# Patient Record
Sex: Female | Born: 1965 | Hispanic: Yes | Marital: Married | Smoking: Never smoker
Health system: Southern US, Community
[De-identification: ages and names within clinical notes are randomized; demographics above are authoritative.]

## PROBLEM LIST (undated history)

## (undated) DIAGNOSIS — E079 Disorder of thyroid, unspecified: Secondary | ICD-10-CM

## (undated) DIAGNOSIS — Z8639 Personal history of other endocrine, nutritional and metabolic disease: Secondary | ICD-10-CM

## (undated) DIAGNOSIS — I10 Essential (primary) hypertension: Secondary | ICD-10-CM

## (undated) DIAGNOSIS — K219 Gastro-esophageal reflux disease without esophagitis: Secondary | ICD-10-CM

## (undated) HISTORY — DX: Disorder of thyroid, unspecified: E07.9

## (undated) HISTORY — DX: Personal history of other endocrine, nutritional and metabolic disease: Z86.39

## (undated) HISTORY — DX: Essential (primary) hypertension: I10

## (undated) HISTORY — PX: TUBAL LIGATION: SHX77

## (undated) HISTORY — DX: Gastro-esophageal reflux disease without esophagitis: K21.9

---

## 2017-03-07 DIAGNOSIS — Z139 Encounter for screening, unspecified: Secondary | ICD-10-CM

## 2017-03-07 LAB — GLUCOSE, POCT (MANUAL RESULT ENTRY): POC GLUCOSE: 96 mg/dL (ref 70–99)

## 2017-03-07 NOTE — Congregational Nurse Program (Unsigned)
Congregational Nurse Program Note  Date of Encounter: 03/07/2017  Past Medical History: No past medical history on file.  Encounter Details:     CNP Questionnaire - 03/07/17 1020      Patient Demographics   Is this a new or existing patient? New   Patient is considered a/an Immigrant   Race Latino/Hispanic     Patient Assistance   Location of Patient Assistance MD Live - [facility]   Patient's financial/insurance status Self-Pay (Uninsured)   Uninsured Patient ([place] Card/[employer] Connects) Yes   Interventions Assisted patient in making appt.   Patient referred to apply for the following financial assistance Not Applicable   Food insecurities addressed Not Applicable   Transportation assistance No   Assistance securing medications Yes   Type of Assistance [employer] health offerings Nutrition;Hypertension;Cardiac disease;Navigating the healthcare system     Encounter Details   Primary purpose of visit Chronic Illness/Condition Visit   Was an Emergency Department visit averted? No   Does patient have a medical provider? No   Patient referred to Clinic   Was a mental health screening completed? (GAINS tool) No   Does patient have dental issues? Yes   Was a dental referral made? No   Does patient have vision issues? Yes   Was a vision referral made? No   Does your patient have an abnormal blood pressure today? Yes   Since previous encounter, have you referred patient for abnormal blood pressure that resulted in a new diagnosis or medication change? No   Since previous encounter, have you referred patient for abnormal blood glucose that resulted in a new diagnosis or medication change? No   Was there a life-saving intervention made? No      Patient was pleasant and humble.  Patient was here due to complaints of headache (side) but none now.  Patients stated she may have high blood pressure.  Once in exam room patients vitals were taken and were 177/114 74  98.1 and rechecked 5 minutes later and was 182/111 67.   Glucose:96 fasting  Patient said " I take Excedrin for my headaches."  MD live was attempted but was having difficulties with it.Patient was hungry so she said she would be back after lunch.  Before patient left Nurses urged Patient to got ER or call 911 if she had symptoms of numbness, chest pain, worsening headaches, facial dropping, and slurred speech.   Patient returned after lunch. Patients vitals were 161/103 92 and 160/101 90.  After several attempts, MD live was used via phone with patient. Dr. Meyer Russel prescribed her Lisinopril/ HCTZ 20/25 mg po daily.   Information faxed to [facility].  Advised patient to take it every day at the same time. Also told patient to drink water, due to frequency of urinating.   [facility] at 8:15  Education information given to patient in regards to hypertension, decrease salt intake, decrease stress and healthy tips.    Patient was reminded to  to got ER or call 911 if she had symptoms of numbness, chest pain, worsening headaches, facial dropping, and slurred speech.  Also if she had any other questions to call [facility] and will gladly help her as much as we could.   Shaley Leavens R. Cutler Sunday LPN [phone]

## 2017-03-09 ENCOUNTER — Telehealth: Payer: Self-pay

## 2017-03-09 NOTE — Telephone Encounter (Signed)
[facility] Patient chief complaint when in the office was headaches and check blood pressure.   Patients blood pressure was elevated starting at 177/114 74. Upon doing a few checks blood pressure maintained elevated. MD live was done and MD Meyer Russel prescribed Lisinopril HCTZ 20/25 mg po daily.  Patient was happy for the follow-up call and said she wasn't able to make it today for a blood pressure recheck at 9 but mentioned she still continues to have headaches and said her husband checked it yesterday and was in the 160 range.   She also mentioned she felt really bad. Referring to after taking her medication and rather take it at night.  Let the patient know that  the headaches are possibly due to an elevated blood pressure. Also told patient it has not been long that she has started medication it may take a little time for body to adjust.    Let patient know that if her headaches get any worse, chest pain, numbness, slurred speech, facial dropping. To please call 911 or got ER right away.   Also if she has any other questions to let [facility] know and will gladly help in any way we can. Reminded patient of her [facility]17 and to mention her symptoms to provider and if MD.    Monia Pouch. Kimmberly Wisser LPN [phone]

## 2017-03-16 ENCOUNTER — Encounter: Payer: Self-pay | Admitting: Physician Assistant

## 2017-03-16 ENCOUNTER — Ambulatory Visit: Payer: Self-pay | Admitting: Physician Assistant

## 2017-03-16 VITALS — BP 136/96 | HR 86 | Temp 97.7°F | Ht 61.0 in | Wt 155.0 lb

## 2017-03-16 DIAGNOSIS — Z131 Encounter for screening for diabetes mellitus: Secondary | ICD-10-CM

## 2017-03-16 DIAGNOSIS — I10 Essential (primary) hypertension: Secondary | ICD-10-CM | POA: Insufficient documentation

## 2017-03-16 DIAGNOSIS — Z8639 Personal history of other endocrine, nutritional and metabolic disease: Secondary | ICD-10-CM

## 2017-03-16 DIAGNOSIS — Z13220 Encounter for screening for lipoid disorders: Secondary | ICD-10-CM

## 2017-03-16 MED ORDER — LISINOPRIL-HYDROCHLOROTHIAZIDE 20-25 MG PO TABS: ORAL_TABLET | ORAL | 0 refills | Status: DC

## 2017-03-16 NOTE — Progress Notes (Signed)
BP (!) 136/96 (BP Location: Left Arm, Patient Position: Sitting, Cuff Size: Normal)   Pulse 86   Temp 97.7 F (36.5 C)   Ht  (1.549 m)   Wt 155 lb (70.3 kg)   SpO2 99%   BMI 29.29 kg/m    Subjective:    Patient ID: [facility], female    DOB: 03-07-1966, 51 y.o.   MRN: [id number]  HPI: [facility] is a 51 y.o. female presenting on 03/16/2017 for New Patient (Initial Visit)   HPI   She is taking the rx given to her at [facility] last week.  That is the first time she had to be on bp medication.  She is not having problems with HA or CP.    She had a mammogram only once about 10 years ago.   Her last PAP ws 2 years ago in [place].   LMP 4 months ago.   Relevant past medical, surgical, family and social history reviewed and updated as indicated. Interim medical history since our last visit reviewed. Allergies and medications reviewed and updated.   Current Outpatient Prescriptions:  .  lisinopril-hydrochlorothiazide (PRINZIDE,ZESTORETIC) 20-25 MG tablet, Take 1 tablet by mouth daily., Disp: , Rfl:   Review of Systems  Constitutional: Negative for appetite change, chills, diaphoresis, fatigue, fever and unexpected weight change.  HENT: Positive for dental problem and hearing loss. Negative for congestion, drooling, ear pain, facial swelling, mouth sores, sneezing, sore throat, trouble swallowing and voice change.   Eyes: Negative for pain, discharge, redness, itching and visual disturbance.  Respiratory: Negative for cough, choking, shortness of breath and wheezing.   Cardiovascular: Negative for chest pain, palpitations and leg swelling.  Gastrointestinal: Negative for abdominal pain, blood in stool, constipation, diarrhea and vomiting.  Endocrine: Negative for cold intolerance, heat intolerance and polydipsia.  Genitourinary: Negative for decreased urine volume, dysuria and hematuria.  Musculoskeletal: Negative for arthralgias, back pain and gait problem.  Skin: Negative  for rash.  Allergic/Immunologic: Negative for environmental allergies.  Neurological: Negative for seizures, syncope, light-headedness and headaches.  Hematological: Negative for adenopathy.  Psychiatric/Behavioral: Negative for agitation, dysphoric mood and suicidal ideas. The patient is not nervous/anxious.     Per HPI unless specifically indicated above     Objective:    BP (!) 136/96 (BP Location: Left Arm, Patient Position: Sitting, Cuff Size: Normal)   Pulse 86   Temp 97.7 F (36.5 C)   Ht  (1.549 m)   Wt 155 lb (70.3 kg)   SpO2 99%   BMI 29.29 kg/m   Wt Readings from Last 3 Encounters:  03/16/17 155 lb (70.3 kg)  03/07/17 156 lb 12.8 oz (71.1 kg)    Physical Exam  Constitutional: She is oriented to person, place, and time. She appears well-developed and well-nourished.  HENT:  Head: Normocephalic and atraumatic.  Mouth/Throat: Oropharynx is clear and moist. No oropharyngeal exudate.  Eyes: Conjunctivae and EOM are normal. Pupils are equal, round, and reactive to light.  Neck: Neck supple. No thyromegaly present.  Cardiovascular: Normal rate and regular rhythm.   Pulmonary/Chest: Effort normal and breath sounds normal.  Abdominal: Soft. Bowel sounds are normal. She exhibits no mass. There is no hepatosplenomegaly. There is no tenderness.  Musculoskeletal: She exhibits no edema.  Lymphadenopathy:    She has no cervical adenopathy.  Neurological: She is alert and oriented to person, place, and time. Gait normal.  Skin: Skin is warm and dry.  Psychiatric: She has a normal mood and  affect. Her behavior is normal.  Vitals reviewed.   Results for orders placed or performed in visit on 03/07/17  POCT glucose  Result Value Ref Range   POC Glucose 96 70 - 99 mg/dl      Assessment & Plan:   Encounter Diagnoses  Name Primary?  . Essential hypertension Yes  . History of thyroid disorder   . Screening cholesterol level   . Screening for diabetes mellitus      -continue current rx -Get baseline labs -Follow up one month.  Will discuss mammogram and PAP at that time.  RTO sooner prn

## 2017-03-17 ENCOUNTER — Telehealth: Payer: Self-pay

## 2017-03-17 NOTE — Telephone Encounter (Signed)
Pt was [facility] at [facility]. After MD live doctor prescribing her Lisinopril/HCTZ 20/25 RN here set her [facility] which was yesterday.   Follow-up call was made to see how patient was doing and how her PCP appointment went.   There was no answer, but LPN left message with patient to see how she is doing and to call [facility] for any questions.     Amoreena Neubert R. Avory Rahimi LPN [phone]

## 2017-03-17 NOTE — Telephone Encounter (Signed)
Nurse returned call with answers from pt recent call with questions.  Pt asked for her last name to be changed and nurse told her that she will have to look into that, and that for now she will stay as [facility] until she can make that correction, if she is able to.  Again nurse apologized for her experience with the doctor, in regards to provider questioning where she lived because of her tag.   Nurse voiced to patient that provider may have been asking because the [facility] only [facility] [place] [facility] and because of her tag from [place] it was questionable and pt does not have id with adress. Pt understood better with that explanation.   Also let her know that she does not need an appointment to get blood drawn and that she can just walk in.   Pt was thankful for the help.  Cledis Sohn R. [name] [phone]

## 2017-03-17 NOTE — Telephone Encounter (Signed)
Pt returned call to [facility].  Pt voiced that appointment went well up to the point that the provider was asking about her license plate and how it was a [place]'s tag.   Pt stated " I don't know if she is racist or what but I did not like that she was getting into my personal things."  Nurse apologized for her feeling that way.   Pt stated she did not know when she had to go to get obtain her blood work and if she will need and ID since pt only has a passport.   Pt also said she wants to change her name to Karina [facility] instead of [facility].  Nurse told patient she will give her a call when she had answers.   Shaletta Hinostroza R. Ariyanah Aguado LPN [phone]

## 2017-03-18 LAB — COMPREHENSIVE METABOLIC PANEL
ALT: 28 U/L (ref 6–29)
AST: 30 U/L (ref 10–35)
Albumin: 4 g/dL (ref 3.6–5.1)
Alkaline Phosphatase: 103 U/L (ref 33–115)
BILIRUBIN TOTAL: 0.6 mg/dL (ref 0.2–1.2)
BUN: 16 mg/dL (ref 7–25)
CO2: 29 mmol/L (ref 20–31)
Calcium: 9.6 mg/dL (ref 8.6–10.2)
Chloride: 100 mmol/L (ref 98–110)
Creat: 0.76 mg/dL (ref 0.50–1.10)
Glucose, Bld: 84 mg/dL (ref 65–99)
Potassium: 3.8 mmol/L (ref 3.5–5.3)
SODIUM: 139 mmol/L (ref 135–146)
Total Protein: 7.5 g/dL (ref 6.1–8.1)

## 2017-03-18 LAB — CBC
HCT: 45.8 % — ABNORMAL HIGH (ref 35.0–45.0)
HEMOGLOBIN: 15.2 g/dL (ref 11.7–15.5)
MCH: 30.8 pg (ref 27.0–33.0)
MCHC: 33.2 g/dL (ref 32.0–36.0)
MCV: 92.9 fL (ref 80.0–100.0)
MPV: 10.8 fL (ref 7.5–12.5)
PLATELETS: 287 10*3/uL (ref 140–400)
RBC: 4.93 MIL/uL (ref 3.80–5.10)
RDW: 14.5 % (ref 11.0–15.0)
WBC: 6.8 10*3/uL (ref 3.8–10.8)

## 2017-03-18 LAB — LIPID PANEL
CHOL/HDL RATIO: 3.7 ratio (ref <5.0)
Cholesterol: 183 mg/dL (ref <200)
HDL: 49 mg/dL — AB (ref >50)
LDL CALC: 115 mg/dL — AB (ref <100)
Triglycerides: 94 mg/dL (ref <150)
VLDL: 19 mg/dL (ref <30)

## 2017-03-18 LAB — TSH: TSH: 2.63 mIU/L

## 2017-03-19 LAB — HEMOGLOBIN A1C
HEMOGLOBIN A1C: 5.4 % (ref <5.7)
MEAN PLASMA GLUCOSE: 108 mg/dL

## 2017-04-19 ENCOUNTER — Encounter: Payer: Self-pay | Admitting: Physician Assistant

## 2017-04-19 ENCOUNTER — Ambulatory Visit: Payer: Self-pay | Admitting: Physician Assistant

## 2017-04-19 VITALS — BP 142/94 | HR 66 | Temp 97.7°F | Ht 61.0 in | Wt 150.2 lb

## 2017-04-19 DIAGNOSIS — Z1239 Encounter for other screening for malignant neoplasm of breast: Secondary | ICD-10-CM

## 2017-04-19 DIAGNOSIS — I10 Essential (primary) hypertension: Secondary | ICD-10-CM

## 2017-04-19 MED ORDER — LISINOPRIL-HYDROCHLOROTHIAZIDE 20-25 MG PO TABS: ORAL_TABLET | ORAL | 4 refills | Status: DC

## 2017-04-19 NOTE — Patient Instructions (Signed)
Dieta restringida en grasas y colesterol (Fat and Cholesterol Restricted Diet) Los niveles altos de grasa y colesterol en la sangre pueden causar varios problemas de salud, como enfermedades del corazn, de los vasos sanguneos, de la vescula, del hgado y del pncreas. Las grasas son fuentes de energa concentrada que existen en varias formas. Consumir en exceso ciertos tipos de grasa, incluidas las grasas saturadas, puede ser perjudicial. El colesterol es una sustancia que el organismo necesita en pequeas cantidades. El cuerpo fabrica todo el colesterol que necesita. El exceso de colesterol proviene de los alimentos que come. Si sus niveles de colesterol y grasas saturadas en la sangre son elevados, puede tener problemas de salud, dado que el exceso de grasa y colesterol se acumula en las paredes de los vasos sanguneos, provocando su estrechamiento. Elegir los alimentos apropiados le permitir controlar su ingesta de grasa y colesterol. Esto le ayudar a mantener los niveles de estas sustancias en la sangre dentro de los lmites normales y a reducir el riesgo de contraer enfermedades. EN QU CONSISTE EL PLAN? El mdico le recomienda que:  Limite la ingesta de grasas a alrededor del _______% del total de caloras por da.  Limite la cantidad de colesterol en su dieta a menos de _________mg por da.  Consuma entre 20 y 30gramos de fibra todos los das. QU TIPOS DE GRASAS DEBO ELEGIR?  Elija grasas saludables con mayor frecuencia. Elija las grasas monoinsaturadas y poliinsaturadas, como el aceite de oliva y canola, las semillas de lino, las nueces, las almendras y las semillas.  Consuma ms grasas omega-3. Las mejores opciones incluyen salmn, caballa, sardinas, atn, aceite de lino y semillas de lino molidas. Trate de consumir pescado al menos dos veces por semana.  Limite el consumo de grasas saturadas. Estas se encuentran principalmente en los productos de origen animal, como las carnes,  la mantequilla y la crema. Las grasas saturadas de origen vegetal incluyen aceite de palma, de palmiste y de coco.  Evite los alimentos con aceites parcialmente hidrogenados. Estos contienen grasas trans. Entre los ejemplos de alimentos con grasas trans se incluyen margarinas en barra, algunas margarinas untables, galletas dulces o saladas y otros productos horneados.  QU PAUTAS GENERALES DEBO SEGUIR? Estas pautas para una alimentacin saludable le ayudarn a controlar su ingesta de grasa y colesterol:  Lea las etiquetas de los alimentos detenidamente para identificar los que contienen grasas trans o altas cantidades de grasas saturadas.  Llene la mitad del plato con verduras y ensaladas de hojas verdes.  Llene un cuarto del plato con cereales integrales. Busque la palabra "integral" en el primer lugar de la lista de ingredientes.  Llene un cuarto del plato con alimentos con protenas magras.  Limite las frutas a dos porciones por da. Elija frutas en lugar de jugos.  Coma ms alimentos que contienen fibra, como manzanas, brcoli, zanahorias, frijoles, guisantes y cebada.  Consuma ms comida casera y menos de restaurante, de buf y comida rpida.  Limite o evite el alcohol.  Limite los alimentos con alto contenido de almidn y azcar.  Limite el consumo de alimentos fritos.  Cocine los alimentos utilizando mtodos que no sean la fritura. Las opciones de coccin ms adecuadas son hornear, hervir, grillar y asar a la parrilla.  Baje de peso si es necesario. Perder solo del 5 al 10% de su peso inicial puede ayudarle a mejorar su estado de salud general y a prevenir enfermedades, como la diabetes y las enfermedades cardacas. QU ALIMENTOS PUEDO COMER? Cereales Cereales integrales,   como los panes de salvado o integrales, las galletas, los cereales y las pastas. Avena sin endulzar, trigo, cebada, quinua o arroz integral. Tortillas de harina de maz o de salvado. Verduras Verduras  frescas o congeladas (crudas, al vapor, asadas o grilladas). Ensaladas de hojas verdes. Frutas Frutas frescas, en conserva (en su jugo natural) o frutas congeladas. Carnes y otros productos con protenas Carne de res molida (al 85% o ms magra), carne de res de animales alimentados con pastos o carne de res sin la grasa. Pollo o pavo sin piel. Carne de pollo o de pavo molida. Cerdo sin la grasa. Todos los pescados y frutos de mar. Huevos. Porotos, guisantes o lentejas secos. Frutos secos o semillas sin sal. Frijoles secos o en lata sin sal. Lcteos Productos lcteos con bajo contenido de grasas, como leche descremada o al 1%, quesos reducidos en grasas o al 2%, ricota con bajo contenido de grasas o queso cottage, o yogur natural con bajo contenido de grasas. Grasas y aceites Margarinas untables que no contengan grasas trans. Mayonesa y condimentos para ensaladas livianos o reducidos en grasas. [place]. Aceites de oliva, canola, ssamo o crtamo. Mantequilla natural de cacahuate o almendra (elija la que no tenga agregado de aceite o azcar). Los artculos mencionados arriba pueden no ser una lista completa de las bebidas o los alimentos recomendados. Comunquese con el nutricionista para conocer ms opciones. QU ALIMENTOS NO SE RECOMIENDAN? Cereales Pan blanco. Pastas blancas. Arroz blanco. Pan de maz. Bagels, pasteles y croissants. Galletas saladas que contengan grasas trans. Verduras Papas blancas. Maz. Verduras con crema o fritas. Verduras en salsa de queso. Frutas Frutas secas. Fruta enlatada en almbar liviano o espeso. Jugo de frutas. Carnes y otros productos con protenas Cortes de carne con grasa. Costillas, alas de pollo, tocineta, salchicha, mortadela, salame, chinchulines, tocino, perros calientes, salchichas alemanas y embutidos envasados. Hgado y otros rganos. Lcteos Leche entera o al 2%, crema, mezcla de leche y crema, y queso crema. Quesos enteros. Yogur entero o  endulzado. Quesos con toda su grasa. Cremas no lcteas y coberturas batidas. Quesos procesados, quesos para untar o cuajadas. Dulces y postres Jarabe de maz, azcares, miel y melazas. Caramelos. Mermelada y jalea. Jarabe. Cereales endulzados. Galletas, pasteles, bizcochuelos, donas, muffins y helado. Grasas y aceites Mantequilla, margarina en barra, manteca de cerdo, grasa, mantequilla clarificada o grasa de tocino. Aceites de coco, de palmiste o de palma. Bebidas Alcohol. Bebidas endulzadas (como refrescos, limonadas y bebidas frutales o ponches). Los artculos mencionados arriba pueden no ser una lista completa de las bebidas y los alimentos que se deben evitar. Comunquese con el nutricionista para obtener ms informacin. Esta informacin no tiene como fin reemplazar el consejo del mdico. Asegrese de hacerle al mdico cualquier pregunta que tenga. Document Released: 11/15/2005 Document Revised: 12/06/2014 Document Reviewed: 02/13/2014 Elsevier Interactive Patient Education  2017 Elsevier Inc.  

## 2017-04-19 NOTE — Progress Notes (Signed)
BP (!) 142/94 (BP Location: Left Arm, Patient Position: Sitting, Cuff Size: Normal)   Pulse 66   Temp 97.7 F (36.5 C) (Other (Comment))   Ht 5\' 1"  (1.549 m)   Wt 150 lb 3.2 oz (68.1 kg)   SpO2 99%   BMI 28.38 kg/m    Subjective:    Patient ID: [facility], female    DOB: June 15, 1966, 51 y.o.   MRN: [id number]  HPI: Karina Mayhewna Sanchez [facility] is a 51 y.o. female presenting on 04/19/2017 for Hypertension   HPI  Pt nervous today with lab results.  [place]ap 2 [place].    Pt is feeling well today.  Relevant past medical, surgical, family and social history reviewed and updated as indicated. Interim medical history since our last visit reviewed. Allergies and medications reviewed and updated.   Current Outpatient Prescriptions:  .  lisinopril-hydrochlorothiazide (PRINZIDE,ZESTORETIC) 20-25 MG tablet, 1 po qd.  Tome una tableta por boca diaria, Disp: 30 tablet, Rfl: 0  Review of Systems  Constitutional: Negative for appetite change, chills, diaphoresis, fatigue, fever and unexpected weight change.  HENT: Negative for congestion, dental problem, drooling, ear pain, facial swelling, hearing loss, mouth sores, sneezing, sore throat, trouble swallowing and voice change.   Eyes: Negative for pain, discharge, redness, itching and visual disturbance.  Respiratory: Negative for cough, choking, shortness of breath and wheezing.   Cardiovascular: Negative for chest pain, palpitations and leg swelling.  Gastrointestinal: Negative for abdominal pain, blood in stool, constipation, diarrhea and vomiting.  Endocrine: Negative for cold intolerance, heat intolerance and polydipsia.  Genitourinary: Negative for decreased urine volume, dysuria and hematuria.  Musculoskeletal: Negative for arthralgias, back pain and gait problem.  Skin: Negative for rash.  Allergic/Immunologic: Negative for environmental allergies.  Neurological: Negative for seizures, syncope, light-headedness and  headaches.  Hematological: Negative for adenopathy.  Psychiatric/Behavioral: Negative for agitation, dysphoric mood and suicidal ideas. The patient is not nervous/anxious.     Per HPI unless specifically indicated above     Objective:    BP (!) 142/94 (BP Location: Left Arm, Patient Position: Sitting, Cuff Size: Normal)   Pulse 66   Temp 97.7 F (36.5 C) (Other (Comment))   Ht 5\' 1"  (1.549 m)   Wt 150 lb 3.2 oz (68.1 kg)   SpO2 99%   BMI 28.38 kg/m   Wt Readings from Last 3 Encounters:  04/19/17 150 lb 3.2 oz (68.1 kg)  03/16/17 155 lb (70.3 kg)  03/07/17 156 lb 12.8 oz (71.1 kg)    Physical Exam  Constitutional: She is oriented to person, place, and time. She appears well-developed and well-nourished.  HENT:  Head: Normocephalic and atraumatic.  Neck: Neck supple.  Cardiovascular: Normal rate and regular rhythm.   Pulmonary/Chest: Effort normal and breath sounds normal.  Abdominal: Soft. Bowel sounds are normal. She exhibits no mass. There is no hepatosplenomegaly. There is no tenderness.  Musculoskeletal: She exhibits no edema.  Lymphadenopathy:    She has no cervical adenopathy.  Neurological: She is alert and oriented to person, place, and time.  Skin: Skin is warm and dry.  Psychiatric: She has a normal mood and affect. Her behavior is normal.  Vitals reviewed.   Results for orders placed or performed in visit on 03/16/17  TSH  Result Value Ref Range   TSH 2.63 mIU/L  Comprehensive Metabolic Panel (CMET)  Result Value Ref Range   Sodium 139 135 - 146 mmol/L   Potassium 3.8 3.5 - 5.3 mmol/L  Chloride 100 98 - 110 mmol/L   CO2 29 20 - 31 mmol/L   Glucose, Bld 84 65 - 99 mg/dL   BUN 16 7 - 25 mg/dL   Creat 1.61 0.96 - 0.45 mg/dL   Total Bilirubin 0.6 0.2 - 1.2 mg/dL   Alkaline Phosphatase 103 33 - 115 U/L   AST 30 10 - 35 U/L   ALT 28 6 - 29 U/L   Total Protein 7.5 6.1 - 8.1 g/dL   Albumin 4.0 3.6 - 5.1 g/dL   Calcium 9.6 8.6 - 40.9 mg/dL  Lipid Profile   Result Value Ref Range   Cholesterol 183 <200 mg/dL   Triglycerides 94 <811 mg/dL   HDL 49 (L) >91 mg/dL   Total CHOL/HDL Ratio 3.7 <5.0 Ratio   VLDL 19 <30 mg/dL   LDL Cholesterol 478 (H) <100 mg/dL  CBC  Result Value Ref Range   WBC 6.8 3.8 - 10.8 K/uL   RBC 4.93 3.80 - 5.10 MIL/uL   Hemoglobin 15.2 11.7 - 15.5 g/dL   HCT 29.5 (H) 62.1 - 30.8 %   MCV 92.9 80.0 - 100.0 fL   MCH 30.8 27.0 - 33.0 pg   MCHC 33.2 32.0 - 36.0 g/dL   RDW 65.7 84.6 - 96.2 %   Platelets 287 140 - 400 K/uL   MPV 10.8 7.5 - 12.5 fL  HgB A1c  Result Value Ref Range   Hgb A1c MFr Bld 5.4 <5.7 %   Mean Plasma Glucose 108 mg/dL      Assessment & Plan:   Encounter Diagnoses  Name Primary?  . Essential hypertension Yes  . Screening for breast cancer     -reviewed labs with pt -will schedule screening mammogram -offered to schedule PAP but Pt wants to wait until next year for PAP -pt to continue current meds -counseled on lowfat diet for mildly elevated lipids -F/u 3 months.  RTO sooner prn (no labs before next OV)

## 2017-05-17 ENCOUNTER — Telehealth: Payer: Self-pay

## 2017-05-17 NOTE — Telephone Encounter (Signed)
[facility] on May 13 2017 1332.   Pt needing [facility] number to due swelling of half of her face and behind the ear.  [facility] number was given to patient and advised to wait for tele prompts until she was able to talk to the Nurse Berenice with the [facility] and if no answer to leave a message.   Suggested patient to [facility] if [facility] and [facility] could possibly help her.    Day Greb R. Gudelia Eugene LPN [phone]

## 2017-05-18 ENCOUNTER — Ambulatory Visit: Payer: Self-pay | Admitting: Physician Assistant

## 2017-05-18 ENCOUNTER — Encounter: Payer: Self-pay | Admitting: Physician Assistant

## 2017-05-18 VITALS — BP 100/60 | HR 72 | Temp 98.1°F | Wt 157.5 lb

## 2017-05-18 DIAGNOSIS — T464X5A Adverse effect of angiotensin-converting-enzyme inhibitors, initial encounter: Secondary | ICD-10-CM

## 2017-05-18 DIAGNOSIS — I10 Essential (primary) hypertension: Secondary | ICD-10-CM

## 2017-05-18 MED ORDER — BISOPROLOL-HYDROCHLOROTHIAZIDE 5-6.25 MG PO TABS: ORAL_TABLET | ORAL | 1 refills | Status: DC

## 2017-05-18 NOTE — Progress Notes (Signed)
BP 100/60 (BP Location: Left Arm, Patient Position: Sitting, Cuff Size: Normal)   Pulse 72   Temp 98.1 F (36.7 C)   Wt 157 lb 8 oz (71.4 kg)   SpO2 98%   BMI 29.76 kg/m    Subjective:    Patient ID: Karina Keller, female    DOB: Jan 06, 1966, 51 y.o.   MRN: [id number]  HPI: Karina Keller is a 51 y.o. female presenting on 05/18/2017 for Facial Swelling (R side of face. pt states is better now. pt states her face feels hard and has swelling on her neck. pt has been taking naproxen )   HPI   Chief Complaint  Patient presents with  . Facial Swelling    R side of face. pt states is better now. pt states her face feels hard and has swelling on her neck. pt has been taking naproxen    This happened on Sunday afternoon.  It came up rather suddenly.  No sob.  Relevant past medical, surgical, family and social history reviewed and updated as indicated. Interim medical history since our last visit reviewed. Allergies and medications reviewed and updated.   Current Outpatient Prescriptions:  .  lisinopril-hydrochlorothiazide (PRINZIDE,ZESTORETIC) 20-25 MG tablet, 1 po qd.  Tome una tableta por boca diaria, Disp: 30 tablet, Rfl: 4   Review of Systems  Constitutional: Negative for appetite change, chills, diaphoresis, fatigue, fever and unexpected weight change.  HENT: Positive for facial swelling. Negative for congestion, dental problem, drooling, ear pain, hearing loss, mouth sores, sneezing, sore throat, trouble swallowing and voice change.   Eyes: Negative for pain, discharge, redness, itching and visual disturbance.  Respiratory: Negative for cough, choking, shortness of breath and wheezing.   Cardiovascular: Negative for chest pain, palpitations and leg swelling.  Gastrointestinal: Negative for abdominal pain, blood in stool, constipation, diarrhea and vomiting.  Endocrine: Negative for cold intolerance, heat intolerance and polydipsia.  Genitourinary: Negative for  decreased urine volume, dysuria and hematuria.  Musculoskeletal: Negative for arthralgias, back pain and gait problem.  Skin: Negative for rash.  Allergic/Immunologic: Negative for environmental allergies.  Neurological: Negative for seizures, syncope, light-headedness and headaches.  Hematological: Negative for adenopathy.  Psychiatric/Behavioral: Negative for agitation, dysphoric mood and suicidal ideas. The patient is not nervous/anxious.     Per HPI unless specifically indicated above     Objective:    BP 100/60 (BP Location: Left Arm, Patient Position: Sitting, Cuff Size: Normal)   Pulse 72   Temp 98.1 F (36.7 C)   Wt 157 lb 8 oz (71.4 kg)   SpO2 98%   BMI 29.76 kg/m   Wt Readings from Last 3 Encounters:  05/18/17 157 lb 8 oz (71.4 kg)  04/19/17 150 lb 3.2 oz (68.1 kg)  03/16/17 155 lb (70.3 kg)    Physical Exam  Constitutional: She is oriented to person, place, and time. She appears well-developed and well-nourished.  HENT:  Head: Normocephalic and atraumatic.  Mouth/Throat: Uvula is midline, oropharynx is clear and moist and mucous membranes are normal. No trismus in the jaw. No uvula swelling.  Neck: Neck supple.  Cardiovascular: Normal rate and regular rhythm.   Pulmonary/Chest: Effort normal and breath sounds normal.  Abdominal: Soft. Bowel sounds are normal. She exhibits no mass. There is no hepatosplenomegaly. There is no tenderness.  Musculoskeletal: She exhibits no edema.  Lymphadenopathy:    She has no cervical adenopathy.  Neurological: She is alert and oriented to person, place, and time.  Skin: Skin is warm  and dry.  Psychiatric: She has a normal mood and affect. Her behavior is normal.  Vitals reviewed.        Assessment & Plan:   Encounter Diagnoses  Name Primary?  . Essential hypertension Yes  . Adverse effect of angiotensin-converting enzyme inhibitor, initial encounter      -D/c lisinopril/hctz.  Discussed with pt likely angioedema due  to ACEI -rx bisoprolol/hctz -pt to follow up one month to recheck BP. RTO sooner prn

## 2017-06-20 ENCOUNTER — Ambulatory Visit: Payer: Self-pay | Admitting: Physician Assistant

## 2017-06-20 ENCOUNTER — Encounter: Payer: Self-pay | Admitting: Physician Assistant

## 2017-06-20 ENCOUNTER — Other Ambulatory Visit: Payer: Self-pay | Admitting: Physician Assistant

## 2017-06-20 VITALS — BP 142/94 | HR 61 | Temp 97.7°F | Ht 61.0 in | Wt 158.0 lb

## 2017-06-20 DIAGNOSIS — R109 Unspecified abdominal pain: Secondary | ICD-10-CM

## 2017-06-20 DIAGNOSIS — S39012A Strain of muscle, fascia and tendon of lower back, initial encounter: Secondary | ICD-10-CM

## 2017-06-20 DIAGNOSIS — Z1211 Encounter for screening for malignant neoplasm of colon: Secondary | ICD-10-CM

## 2017-06-20 DIAGNOSIS — I10 Essential (primary) hypertension: Secondary | ICD-10-CM

## 2017-06-20 LAB — POCT URINALYSIS DIPSTICK
BILIRUBIN UA: NEGATIVE
Blood, UA: NEGATIVE
GLUCOSE UA: NEGATIVE
Ketones, UA: NEGATIVE
Leukocytes, UA: NEGATIVE
NITRITE UA: NEGATIVE
UROBILINOGEN UA: 0.2 U/dL
pH, UA: 5 (ref 5.0–8.0)

## 2017-06-20 MED ORDER — BISOPROLOL-HYDROCHLOROTHIAZIDE 10-6.25 MG PO TABS: 1.0000 | ORAL_TABLET | Freq: Every day | ORAL | 1 refills | Status: DC

## 2017-06-20 NOTE — Progress Notes (Signed)
BP (!) 142/94 (BP Location: Left Arm, Patient Position: Sitting, Cuff Size: Normal)   Pulse 61   Temp 97.7 F (36.5 C)   Ht 5\' 1"  (1.549 m)   Wt 158 lb (71.7 kg)   SpO2 98%   BMI 29.85 kg/m    Subjective:    Patient ID: Karina Keller, female    DOB: 1966/11/21, 51 y.o.   MRN: [id number]  HPI: Karina Keller is a 51 y.o. female presenting on 06/20/2017 for Hypertension   HPI  Pt has had no problems with her new medication.   She says she wants the "real" medication, not the generic.  C/o L side started hurting since yesterday.  She denies injury but does a lot of pushing and pulling and twisting at work.   No sob, abd pain, N/V.  BMs normal.   Relevant past medical, surgical, family and social history reviewed and updated as indicated. Interim medical history since our last visit reviewed. Allergies and medications reviewed and updated.   Current Outpatient Prescriptions:  .  bisoprolol-hydrochlorothiazide (ZIAC) 5-6.25 MG tablet, 1 po qd.  Tome una tableta por boca diaria, Disp: 30 tablet, Rfl: 1   Review of Systems  Constitutional: Negative for appetite change, chills, diaphoresis, fatigue, fever and unexpected weight change.  HENT: Negative for congestion, dental problem, drooling, ear pain, facial swelling, hearing loss, mouth sores, sneezing, sore throat, trouble swallowing and voice change.   Eyes: Negative for pain, discharge, redness, itching and visual disturbance.  Respiratory: Negative for cough, choking, shortness of breath and wheezing.   Cardiovascular: Negative for chest pain, palpitations and leg swelling.  Gastrointestinal: Negative for abdominal pain, blood in stool, constipation, diarrhea and vomiting.  Endocrine: Negative for cold intolerance, heat intolerance and polydipsia.  Genitourinary: Negative for decreased urine volume, dysuria and hematuria.  Musculoskeletal: Negative for arthralgias, back pain and gait problem.  Skin: Negative for  rash.  Allergic/Immunologic: Negative for environmental allergies.  Neurological: Negative for seizures, syncope, light-headedness and headaches.  Hematological: Negative for adenopathy.  Psychiatric/Behavioral: Negative for agitation, dysphoric mood and suicidal ideas. The patient is not nervous/anxious.     Per HPI unless specifically indicated above     Objective:    BP (!) 142/94 (BP Location: Left Arm, Patient Position: Sitting, Cuff Size: Normal)   Pulse 61   Temp 97.7 F (36.5 C)   Ht 5\' 1"  (1.549 m)   Wt 158 lb (71.7 kg)   SpO2 98%   BMI 29.85 kg/m   Wt Readings from Last 3 Encounters:  06/20/17 158 lb (71.7 kg)  05/18/17 157 lb 8 oz (71.4 kg)  04/19/17 150 lb 3.2 oz (68.1 kg)    Physical Exam  Constitutional: She is oriented to person, place, and time. She appears well-developed and well-nourished.  HENT:  Head: Normocephalic and atraumatic.  Neck: Neck supple.  Cardiovascular: Normal rate and regular rhythm.   Pulmonary/Chest: Effort normal and breath sounds normal.  Abdominal: Soft. Bowel sounds are normal. She exhibits no distension, no fluid wave and no mass. There is no hepatosplenomegaly. There is no tenderness.  Musculoskeletal: She exhibits no edema.       Thoracic back: She exhibits tenderness. She exhibits normal range of motion, no bony tenderness, no swelling, no edema and no deformity.       Back:  Lymphadenopathy:    She has no cervical adenopathy.  Neurological: She is alert and oriented to person, place, and time.  Skin: Skin is warm and dry.  Psychiatric: She has a normal mood and affect. Her behavior is normal.  Vitals reviewed.       Assessment & Plan:   Encounter Diagnoses  Name Primary?  . Essential hypertension Yes  . Flank pain   . Back strain, initial encounter   . Screening for colon cancer     -UA normal. Discussed with pt that pain appears to be MS in nature.  Recommended ice/heat and apap or ibu and avoiding  overexertion -gave iFOBT for colon cancer screening -will Increase ziac.  Counseled pt that the "real" brand name rx is around $200/month and that the generic will work just as well; there is nothing inferior about it.  Pt agrees. -F/u 1 month for recheck of the BP.  RTO sooner prn

## 2017-06-20 NOTE — Patient Instructions (Signed)
Tylenol or ibuprofen as needed for back pain.   Heat or ice may help as well.    Tome tylenol o ibuprofen cuando sea necesario para el dolor de espalda  caliente or frio puede ayudar tambien

## 2017-07-25 ENCOUNTER — Encounter: Payer: Self-pay | Admitting: Physician Assistant

## 2017-07-25 ENCOUNTER — Ambulatory Visit: Payer: Self-pay | Admitting: Physician Assistant

## 2017-07-26 NOTE — Progress Notes (Signed)
Erroneous- disregard   Subjective:    HPI   Review of Systems   Objective:      Physical Exam  Assessment & Plan:

## 2017-08-08 ENCOUNTER — Ambulatory Visit: Payer: Self-pay | Admitting: Physician Assistant

## 2017-08-15 ENCOUNTER — Encounter: Payer: Self-pay | Admitting: Physician Assistant

## 2017-08-29 ENCOUNTER — Encounter: Payer: Self-pay | Admitting: Physician Assistant

## 2017-08-29 ENCOUNTER — Ambulatory Visit: Payer: Self-pay | Admitting: Physician Assistant

## 2017-08-29 ENCOUNTER — Other Ambulatory Visit (HOSPITAL_COMMUNITY)
Admission: RE | Admit: 2017-08-29 | Discharge: 2017-08-29 | Disposition: A | Discharge status: Home / Self Care | Payer: Self-pay | Source: Ambulatory Visit | Attending: Physician Assistant | Admitting: Physician Assistant

## 2017-08-29 VITALS — BP 138/82 | HR 66 | Temp 97.7°F | Ht 61.0 in | Wt 154.0 lb

## 2017-08-29 DIAGNOSIS — I10 Essential (primary) hypertension: Secondary | ICD-10-CM | POA: Insufficient documentation

## 2017-08-29 DIAGNOSIS — Z1239 Encounter for other screening for malignant neoplasm of breast: Secondary | ICD-10-CM

## 2017-08-29 LAB — BASIC METABOLIC PANEL
ANION GAP: 7 (ref 5–15)
BUN: 15 mg/dL (ref 6–20)
CHLORIDE: 103 mmol/L (ref 101–111)
CO2: 30 mmol/L (ref 22–32)
Calcium: 8.8 mg/dL — ABNORMAL LOW (ref 8.9–10.3)
Creatinine, Ser: 0.58 mg/dL (ref 0.44–1.00)
Glucose, Bld: 113 mg/dL — ABNORMAL HIGH (ref 65–99)
POTASSIUM: 3.2 mmol/L — AB (ref 3.5–5.1)
SODIUM: 140 mmol/L (ref 135–145)

## 2017-08-29 MED ORDER — BISOPROLOL-HYDROCHLOROTHIAZIDE 10-6.25 MG PO TABS: ORAL_TABLET | ORAL | 3 refills | Status: DC

## 2017-08-29 NOTE — Progress Notes (Signed)
   BP 138/82 (BP Location: Left Arm, Patient Position: Sitting, Cuff Size: Normal)   Pulse 66   Temp 97.7 F (36.5 C)   Ht  (1.549 m)   Wt 154 lb (69.9 kg)   SpO2 99%   BMI 29.10 kg/m    Subjective:    Patient ID: Karina Keller, female    DOB: 1966-02-23, 51 y.o.   MRN: [id number]  HPI: Karina Keller is a 51 y.o. female presenting on 08/29/2017 for Follow-up   HPI  Pt states last mammogram was 4 year ago in [place]  Last PAP about 2 1/2 year ago in [place]  Pt is doing well  Relevant past medical, surgical, family and social history reviewed and updated as indicated. Interim medical history since our last visit reviewed. Allergies and medications reviewed and updated.   Current Outpatient Prescriptions:  .  bisoprolol-hydrochlorothiazide (ZIAC) 10-6.25 MG tablet, Take 1 tablet by mouth daily., Disp: 30 tablet, Rfl: 1   Review of Systems  Constitutional: Negative for appetite change, chills, diaphoresis, fatigue, fever and unexpected weight change.  HENT: Negative for congestion, dental problem, drooling, ear pain, facial swelling, hearing loss, mouth sores, sneezing, sore throat, trouble swallowing and voice change.   Eyes: Negative for pain, discharge, redness, itching and visual disturbance.  Respiratory: Negative for cough, choking, shortness of breath and wheezing.   Cardiovascular: Negative for chest pain, palpitations and leg swelling.  Gastrointestinal: Negative for abdominal pain, blood in stool, constipation, diarrhea and vomiting.  Endocrine: Negative for cold intolerance, heat intolerance and polydipsia.  Genitourinary: Negative for decreased urine volume, dysuria and hematuria.  Musculoskeletal: Negative for arthralgias, back pain and gait problem.  Skin: Negative for rash.  Allergic/Immunologic: Negative for environmental allergies.  Neurological: Negative for seizures, syncope, light-headedness and headaches.  Hematological: Negative for  adenopathy.  Psychiatric/Behavioral: Negative for agitation, dysphoric mood and suicidal ideas. The patient is not nervous/anxious.     Per HPI unless specifically indicated above     Objective:    BP 138/82 (BP Location: Left Arm, Patient Position: Sitting, Cuff Size: Normal)   Pulse 66   Temp 97.7 F (36.5 C)   Ht  (1.549 m)   Wt 154 lb (69.9 kg)   SpO2 99%   BMI 29.10 kg/m   Wt Readings from Last 3 Encounters:  08/29/17 154 lb (69.9 kg)  07/25/17 156 lb 4 oz (70.9 kg)  06/20/17 158 lb (71.7 kg)    Physical Exam  Constitutional: She is oriented to person, place, and time. She appears well-developed and well-nourished.  HENT:  Head: Normocephalic and atraumatic.  Neck: Neck supple.  Cardiovascular: Normal rate and regular rhythm.   Pulmonary/Chest: Effort normal and breath sounds normal.  Abdominal: Soft. Bowel sounds are normal. She exhibits no mass. There is no hepatosplenomegaly. There is no tenderness.  Musculoskeletal: She exhibits no edema.  Lymphadenopathy:    She has no cervical adenopathy.  Neurological: She is alert and oriented to person, place, and time.  Skin: Skin is warm and dry.  Psychiatric: She has a normal mood and affect. Her behavior is normal.  Vitals reviewed.       Assessment & Plan:   Encounter Diagnoses  Name Primary?  . Essential hypertension Yes  . Screening for breast cancer     -Check bmp. Will call with result -Order mammogram -pt to follow up in 3 months with PAP at that appointment

## 2017-08-30 ENCOUNTER — Other Ambulatory Visit: Payer: Self-pay | Admitting: Physician Assistant

## 2017-08-30 DIAGNOSIS — E876 Hypokalemia: Secondary | ICD-10-CM

## 2017-08-30 DIAGNOSIS — I10 Essential (primary) hypertension: Secondary | ICD-10-CM

## 2017-09-05 ENCOUNTER — Encounter: Payer: Self-pay | Admitting: Physician Assistant

## 2017-09-05 ENCOUNTER — Ambulatory Visit: Payer: Self-pay | Admitting: Physician Assistant

## 2017-09-05 VITALS — BP 138/96 | HR 72 | Temp 97.7°F | Ht 61.0 in | Wt 153.0 lb

## 2017-09-05 DIAGNOSIS — I10 Essential (primary) hypertension: Secondary | ICD-10-CM

## 2017-09-05 DIAGNOSIS — E876 Hypokalemia: Secondary | ICD-10-CM

## 2017-09-05 DIAGNOSIS — R200 Anesthesia of skin: Secondary | ICD-10-CM

## 2017-09-05 MED ORDER — BISOPROLOL-HYDROCHLOROTHIAZIDE 10-6.25 MG PO TABS: ORAL_TABLET | ORAL | 3 refills | Status: DC

## 2017-09-05 NOTE — Progress Notes (Signed)
BP (!) 138/96 (BP Location: Left Arm, Patient Position: Sitting, Cuff Size: Normal)   Pulse 72   Temp 97.7 F (36.5 C) (Other (Comment))   Ht  (1.549 m)   Wt 153 lb (69.4 kg)   SpO2 98%   BMI 28.91 kg/m    Subjective:    Patient ID: Karina Keller, female    DOB: [birth year], 51 y.o.   MRN: [id number]  HPI: Karina Keller is a 51 y.o. female presenting on 09/05/2017 for Headache ("head numbing")   HPI  Pt states she is having numbness on her scalp.  She says the L side feels numb when she lays on it.  She says it does not feel numb at any other times.  No HA, vision changes.    She says she hasn't taken her bp med yet today because she was running in a hurry to get to her appointment.   Relevant past medical, surgical, family and social history reviewed and updated as indicated. Interim medical history since our last visit reviewed. Allergies and medications reviewed and updated.   Current Outpatient Prescriptions:  .  bisoprolol-hydrochlorothiazide (ZIAC) 10-6.25 MG tablet, 1 po qd.  Tome una tableta por boca diaria, Disp: 30 tablet, Rfl: 3   Review of Systems  Constitutional: Negative for appetite change, chills, diaphoresis, fatigue, fever and unexpected weight change.  HENT: Positive for dental problem. Negative for congestion, drooling, ear pain, facial swelling, hearing loss, mouth sores, sneezing, sore throat, trouble swallowing and voice change.   Eyes: Negative for pain, discharge, redness, itching and visual disturbance.  Respiratory: Negative for cough, choking, shortness of breath and wheezing.   Cardiovascular: Negative for chest pain, palpitations and leg swelling.  Gastrointestinal: Negative for abdominal pain, blood in stool, constipation, diarrhea and vomiting.  Endocrine: Negative for cold intolerance, heat intolerance and polydipsia.  Genitourinary: Negative for decreased urine volume, dysuria and hematuria.  Musculoskeletal: Negative for  arthralgias, back pain and gait problem.  Skin: Negative for rash.  Allergic/Immunologic: Negative for environmental allergies.  Neurological: Positive for headaches. Negative for seizures, syncope and light-headedness.  Hematological: Negative for adenopathy.  Psychiatric/Behavioral: Negative for agitation, dysphoric mood and suicidal ideas. The patient is not nervous/anxious.     Per HPI unless specifically indicated above     Objective:    BP (!) 138/96 (BP Location: Left Arm, Patient Position: Sitting, Cuff Size: Normal)   Pulse 72   Temp 97.7 F (36.5 C) (Other (Comment))   Ht  (1.549 m)   Wt 153 lb (69.4 kg)   SpO2 98%   BMI 28.91 kg/m   Wt Readings from Last 3 Encounters:  09/05/17 153 lb (69.4 kg)  08/29/17 154 lb (69.9 kg)  07/25/17 156 lb 4 oz (70.9 kg)    Physical Exam  Constitutional: She is oriented to person, place, and time. She appears well-developed and well-nourished.  HENT:  Head: Normocephalic and atraumatic.  Neck: Neck supple.  Cardiovascular: Normal rate and regular rhythm.   Pulmonary/Chest: Effort normal and breath sounds normal.  Abdominal: Soft. Bowel sounds are normal. She exhibits no mass. There is no hepatosplenomegaly. There is no tenderness.  Musculoskeletal: She exhibits no edema.  Lymphadenopathy:    She has no cervical adenopathy.  Neurological: She is alert and oriented to person, place, and time. She has normal strength. She displays no tremor. No cranial nerve deficit or sensory deficit. She exhibits normal muscle tone. Coordination and gait normal.  Skin: Skin is warm and  dry.  Scalp examined and no redness, lesion or other abnormality seen.   Psychiatric: She has a normal mood and affect. Her behavior is normal.  Vitals reviewed.       Assessment & Plan:    Encounter Diagnoses  Name Primary?  . Essential hypertension Yes  . Numbness   . Hypokalemia     -pt counseled to avoid laying on the left since this is the only  time she has her complaint.  Also encouraged pt to wear her hair more loose (ie pony-tail is pulled extremely tight today) -pt reminded to get labs drawn next week to recheck K+ -pt to Follow up January as scheduled.  RTO sooner prn

## 2017-09-12 ENCOUNTER — Other Ambulatory Visit (HOSPITAL_COMMUNITY)
Admission: RE | Admit: 2017-09-12 | Discharge: 2017-09-12 | Disposition: A | Discharge status: Home / Self Care | Payer: Self-pay | Source: Ambulatory Visit | Attending: Physician Assistant | Admitting: Physician Assistant

## 2017-09-12 DIAGNOSIS — I10 Essential (primary) hypertension: Secondary | ICD-10-CM | POA: Insufficient documentation

## 2017-09-12 DIAGNOSIS — E876 Hypokalemia: Secondary | ICD-10-CM | POA: Insufficient documentation

## 2017-09-12 LAB — POTASSIUM: POTASSIUM: 3.3 mmol/L — AB (ref 3.5–5.1)

## 2017-09-14 ENCOUNTER — Other Ambulatory Visit: Payer: Self-pay | Admitting: Physician Assistant

## 2017-09-14 DIAGNOSIS — E876 Hypokalemia: Secondary | ICD-10-CM

## 2017-09-14 DIAGNOSIS — I10 Essential (primary) hypertension: Secondary | ICD-10-CM

## 2017-09-14 MED ORDER — POTASSIUM CHLORIDE ER 10 MEQ PO TBCR: EXTENDED_RELEASE_TABLET | ORAL | 0 refills | Status: DC

## 2017-09-15 ENCOUNTER — Other Ambulatory Visit: Payer: Self-pay | Admitting: Physician Assistant

## 2017-09-15 DIAGNOSIS — Z1231 Encounter for screening mammogram for malignant neoplasm of breast: Secondary | ICD-10-CM

## 2017-12-05 ENCOUNTER — Encounter: Payer: Self-pay | Admitting: Physician Assistant

## 2017-12-05 ENCOUNTER — Other Ambulatory Visit: Payer: Self-pay | Admitting: Physician Assistant

## 2017-12-05 ENCOUNTER — Ambulatory Visit: Payer: Self-pay | Admitting: Physician Assistant

## 2017-12-05 ENCOUNTER — Other Ambulatory Visit (HOSPITAL_COMMUNITY)
Admission: RE | Admit: 2017-12-05 | Discharge: 2017-12-05 | Disposition: A | Discharge status: Home / Self Care | Payer: Self-pay | Source: Ambulatory Visit | Attending: Physician Assistant | Admitting: Physician Assistant

## 2017-12-05 VITALS — BP 146/92 | HR 64 | Temp 97.3°F | Ht 61.0 in | Wt 150.0 lb

## 2017-12-05 DIAGNOSIS — Z9119 Patient's noncompliance with other medical treatment and regimen: Secondary | ICD-10-CM

## 2017-12-05 DIAGNOSIS — I10 Essential (primary) hypertension: Secondary | ICD-10-CM

## 2017-12-05 DIAGNOSIS — Z5320 Procedure and treatment not carried out because of patient's decision for unspecified reasons: Secondary | ICD-10-CM

## 2017-12-05 DIAGNOSIS — E876 Hypokalemia: Secondary | ICD-10-CM

## 2017-12-05 DIAGNOSIS — Z91199 Patient's noncompliance with other medical treatment and regimen due to unspecified reason: Secondary | ICD-10-CM

## 2017-12-05 LAB — BASIC METABOLIC PANEL
Anion gap: 9 (ref 5–15)
BUN: 13 mg/dL (ref 6–20)
CALCIUM: 8.7 mg/dL — AB (ref 8.9–10.3)
CO2: 28 mmol/L (ref 22–32)
CREATININE: 0.66 mg/dL (ref 0.44–1.00)
Chloride: 101 mmol/L (ref 101–111)
GFR calc Af Amer: >60 mL/min (ref >60)
GFR calc non Af Amer: >60 mL/min (ref >60)
GLUCOSE: 106 mg/dL — AB (ref 65–99)
Potassium: 3.3 mmol/L — ABNORMAL LOW (ref 3.5–5.1)
Sodium: 138 mmol/L (ref 135–145)

## 2017-12-05 MED ORDER — ATENOLOL 50 MG PO TABS: ORAL_TABLET | ORAL | 1 refills | Status: DC

## 2017-12-05 MED ORDER — BISOPROLOL-HYDROCHLOROTHIAZIDE 10-6.25 MG PO TABS: ORAL_TABLET | ORAL | 1 refills | Status: DC

## 2017-12-05 NOTE — Progress Notes (Signed)
BP (!) 146/92 (BP Location: Left Arm, Patient Position: Sitting, Cuff Size: Normal)   Pulse 64   Temp (!) 97.3 F (36.3 C) (Other (Comment))   Ht 5\' 1"  (1.549 m)   Wt 150 lb (68 kg)   LMP 05/05/2017 (Approximate)   SpO2 96%   BMI 28.34 kg/m    Subjective:    Patient ID: Karina Keller, female    DOB: Oct 20, 1966, 52 y.o.   MRN: [id number]  HPI: Karina Keller is a 52 y.o. female presenting on 12/05/2017 for Hypertension and Gynecologic Exam   HPI    Pt never went and got her blood  Potassium rechecked in October after she finished taking her potassium/k-dur.   Pt has been scheduled for 2 slots today so she could have a PAP done but she refuses saying that she needs to get back to work.   Relevant past medical, surgical, family and social history reviewed and updated as indicated. Interim medical history since our last visit reviewed. Allergies and medications reviewed and updated.   Current Outpatient Medications:  .  bisoprolol-hydrochlorothiazide (ZIAC) 10-6.25 MG tablet, 1 po qd.  Tome una tableta por boca diaria, Disp: 30 tablet, Rfl: 3  Review of Systems  Constitutional: Negative for appetite change, chills, diaphoresis, fatigue, fever and unexpected weight change.  HENT: Negative for congestion, dental problem, drooling, ear pain, facial swelling, hearing loss, mouth sores, sneezing, sore throat, trouble swallowing and voice change.   Eyes: Negative for pain, discharge, redness, itching and visual disturbance.  Respiratory: Negative for cough, choking, shortness of breath and wheezing.   Cardiovascular: Negative for chest pain, palpitations and leg swelling.  Gastrointestinal: Negative for abdominal pain, blood in stool, constipation, diarrhea and vomiting.  Endocrine: Negative for cold intolerance, heat intolerance and polydipsia.  Genitourinary: Negative for decreased urine volume, dysuria and hematuria.  Musculoskeletal: Negative for arthralgias, back pain  and gait problem.  Skin: Negative for rash.  Allergic/Immunologic: Negative for environmental allergies.  Neurological: Negative for seizures, syncope, light-headedness and headaches.  Hematological: Negative for adenopathy.  Psychiatric/Behavioral: Negative for agitation, dysphoric mood and suicidal ideas. The patient is not nervous/anxious.     Per HPI unless specifically indicated above     Objective:    BP (!) 146/92 (BP Location: Left Arm, Patient Position: Sitting, Cuff Size: Normal)   Pulse 64   Temp (!) 97.3 F (36.3 C) (Other (Comment))   Ht 5\' 1"  (1.549 m)   Wt 150 lb (68 kg)   LMP 05/05/2017 (Approximate)   SpO2 96%   BMI 28.34 kg/m   Wt Readings from Last 3 Encounters:  12/05/17 150 lb (68 kg)  09/05/17 153 lb (69.4 kg)  08/29/17 154 lb (69.9 kg)    Physical Exam  Constitutional: She is oriented to person, place, and time. She appears well-developed and well-nourished.  HENT:  Head: Normocephalic and atraumatic.  Neck: Neck supple.  Cardiovascular: Normal rate and regular rhythm.  Pulmonary/Chest: Effort normal and breath sounds normal.  Abdominal: Soft. Bowel sounds are normal. She exhibits no mass. There is no hepatosplenomegaly. There is no tenderness.  Musculoskeletal: She exhibits no edema.  Lymphadenopathy:    She has no cervical adenopathy.  Neurological: She is alert and oriented to person, place, and time.  Skin: Skin is warm and dry.  Psychiatric: She has a normal mood and affect. Her behavior is normal.  Vitals reviewed.   Results for orders placed or performed during the hospital encounter of 09/12/17  Potassium  Result Value Ref Range   Potassium 3.3 (L) 3.5 - 5.1 mmol/L      Assessment & Plan:   Encounter Diagnoses  Name Primary?  . Essential hypertension Yes  . Hypokalemia   . Pap smear of cervix declined   . Personal history of noncompliance with medical treatment, presenting hazards to health     -Pt counseled to go to lab  after OV today to recheck K+. Pt doesn't want to go to lab.  She was warned that this is potentially life threatening if her K+ has gone too low -discussed may change bp med at next OV if K+ still low -pt to follow up 1 month to recheck bp.  RTO sooner prn

## 2017-12-19 ENCOUNTER — Encounter: Payer: Self-pay | Admitting: Physician Assistant

## 2017-12-19 ENCOUNTER — Ambulatory Visit: Payer: Self-pay | Admitting: Physician Assistant

## 2017-12-19 VITALS — BP 164/100 | HR 77 | Temp 97.9°F | Ht 61.0 in | Wt 151.0 lb

## 2017-12-19 DIAGNOSIS — J00 Acute nasopharyngitis [common cold]: Secondary | ICD-10-CM

## 2017-12-19 MED ORDER — BENZONATATE 100 MG PO CAPS: ORAL_CAPSULE | ORAL | 3 refills | Status: DC

## 2017-12-19 NOTE — Progress Notes (Signed)
BP (!) 164/100 (BP Location: Left Arm, Patient Position: Sitting, Cuff Size: Normal)   Pulse 77   Temp 97.9 F (36.6 C)   Ht 5\' 1"  (1.549 m)   Wt 151 lb (68.5 kg)   SpO2 97%   BMI 28.53 kg/m    Subjective:    Patient ID: Karina MayhewAna Sanchez Keller, Keller    DOB: [birth year], 52 y.o.   MRN: [id number]  HPI: Karina Keller is a 52 y.o. Keller presenting on 12/19/2017 for Cough (nasal congestion, sub fever, body soreness, on and off chills. symptoms began yesterday. pt states she had taken tylenol and states it has been little helpful. pt states was hardly able to sleep last night)   HPI   Chief Complaint  Patient presents with  . Cough    nasal congestion, sub fever, body soreness, on and off chills. symptoms began yesterday. pt states she had taken tylenol and states it has been little helpful. pt states was hardly able to sleep last night    Pt also coughing.  No emesis.   Pt has taken no meds for URI except for tylenol yesterday.    Relevant past medical, surgical, family and social history reviewed and updated as indicated. Interim medical history since our last visit reviewed. Allergies and medications reviewed and updated.   Current Outpatient Medications:  .  atenolol (TENORMIN) 50 MG tablet, 1 po qd.  Tome una tableta por boca diaria, Disp: 30 tablet, Rfl: 1   Review of Systems  Constitutional: Positive for chills and fever (subjective). Negative for appetite change, diaphoresis, fatigue and unexpected weight change.  HENT: Positive for sneezing and sore throat. Negative for congestion, drooling, ear pain, facial swelling, hearing loss, mouth sores, trouble swallowing and voice change.   Eyes: Negative for pain, discharge, redness, itching and visual disturbance.  Respiratory: Positive for cough. Negative for choking, shortness of breath and wheezing.   Cardiovascular: Negative for chest pain, palpitations and leg swelling.  Gastrointestinal: Negative for abdominal  pain, blood in stool, constipation, diarrhea and vomiting.  Endocrine: Negative for cold intolerance, heat intolerance and polydipsia.  Genitourinary: Negative for decreased urine volume, dysuria and hematuria.  Musculoskeletal: Negative for arthralgias, back pain and gait problem.  Skin: Negative for rash.  Allergic/Immunologic: Negative for environmental allergies.  Neurological: Positive for headaches. Negative for seizures, syncope and light-headedness.  Hematological: Negative for adenopathy.  Psychiatric/Behavioral: Negative for agitation, dysphoric mood and suicidal ideas. The patient is not nervous/anxious.     Per HPI unless specifically indicated above     Objective:    BP (!) 164/100 (BP Location: Left Arm, Patient Position: Sitting, Cuff Size: Normal)   Pulse 77   Temp 97.9 F (36.6 C)   Ht 5\' 1"  (1.549 m)   Wt 151 lb (68.5 kg)   SpO2 97%   BMI 28.53 kg/m   Wt Readings from Last 3 Encounters:  12/19/17 151 lb (68.5 kg)  12/05/17 150 lb (68 kg)  09/05/17 153 lb (69.4 kg)    Physical Exam  Constitutional: She is oriented to person, place, and time. She appears well-developed and well-nourished.  HENT:  Head: Normocephalic and atraumatic.  Right Ear: Hearing, tympanic membrane, external ear and ear canal normal.  Left Ear: Hearing, tympanic membrane, external ear and ear canal normal.  Nose: Nose normal.  Mouth/Throat: Uvula is midline and oropharynx is clear and moist. No oropharyngeal exudate.  Neck: Neck supple.  Cardiovascular: Normal rate and regular rhythm.  Pulmonary/Chest: Effort normal and  breath sounds normal. She has no wheezes.  Abdominal: Soft. Bowel sounds are normal. She exhibits no mass. There is no hepatosplenomegaly. There is no tenderness.  Musculoskeletal: She exhibits no edema.  Lymphadenopathy:    She has no cervical adenopathy.  Neurological: She is alert and oriented to person, place, and time.  Skin: Skin is warm and dry.  Psychiatric:  She has a normal mood and affect. Her behavior is normal.  Vitals reviewed.       Assessment & Plan:    Encounter Diagnosis  Name Primary?  . Acute nasopharyngitis Yes   -pt counseled to rest, drink plenty fluids, APAP or IBU prn.  Gave rx for tessalon.  Gave handout which discussed further symptomatic treatment.   Gave out of work note x 2 days -pt to follow up as scheduled for recheck bp.  RTO sooner prn

## 2017-12-19 NOTE — Patient Instructions (Signed)
Infección de las vías aéreas superiores en los adultos.  Upper Respiratory Infection, Adult  La mayoría de las infecciones del tracto respiratorio superior son infecciones virales de las vías que llevan el aire a los pulmones. Un infección del tracto respiratorio superior afecta la nariz, la garganta y las vías respiratorias superiores. El tipo más frecuente de infección del tracto respiratorio superior es la nasofaringitis, que habitualmente se conoce como "resfrío común".  Las infecciones del tracto respiratorio superior siguen su curso y por lo general se curan solas. En la mayoría de los casos, la infección del tracto respiratorio superior no requiere atención médica, pero a veces, después de una infección viral, puede surgir una infección bacteriana en las vías respiratorias superiores. Esto se conoce como infección secundaria. Las infecciones sinusales y en el oído medio son tipos frecuentes de infecciones secundarias en el tracto respiratorio superior.  La neumonía bacteriana también puede complicar una infección del tracto respiratorio superior. Este tipo de infección puede empeorar el asma y la enfermedad pulmonar obstructiva crónica (EPOC). En algunos casos, estas complicaciones pueden requerir atención médica de emergencia y poner en peligro la vida.  ¿Cuáles son las causas?  Casi todas las infecciones del tracto respiratorio superior se deben a los virus. Un virus es un tipo de germen que puede contagiarse de una persona a otra.  ¿Qué incrementa el riesgo?  Puede estar en riesgo de sufrir una infección del tracto respiratorio superior si:  · Fuma.  · Tiene una enfermedad pulmonar o cardíaca crónica.  · Tiene debilitado el sistema de defensa (inmunitario) del cuerpo.  · Es muy joven o de edad muy avanzada.  · Tiene asma o alergias nasales.  · Trabaja en áreas donde hay mucha gente o poca ventilación.  · Trabaja en una escuela o en un centro de atención médica.    ¿Cuáles son los signos o los  síntomas?  Habitualmente, los síntomas aparecen de 2 a 3 días después de entrar en contacto con el virus del resfrío. La mayoría de las infecciones virales en el tracto respiratorio superior duran de 7 a 10 días. Sin embargo, las infecciones virales en el tracto respiratorio superior a causa del virus de la gripe pueden durar de 14 a 18 días y, habitualmente, son más graves. Entre los síntomas se pueden incluir los siguientes:  · Secreción o congestión nasal.  · Estornudos.  · Tos.  · Dolor de garganta.  · Dolor de cabeza.  · Fatiga.  · Fiebre.  · Pérdida del apetito.  · Dolor en la frente, detrás de los ojos y por encima de los pómulos (dolor sinusal).  · Dolores musculares.    ¿Cómo se diagnostica?  El médico puede diagnosticar una infección del tracto respiratorio superior mediante los siguientes estudios:  · Examen físico.  · Pruebas para verificar si los síntomas no se deben a otra afección, por ejemplo:  ? Faringitis estreptocócica.  ? Sinusitis.  ? Neumonía.  ? Asma.    ¿Cómo se trata?  Esta infección desaparece sola con el tiempo. No puede curarse con medicamentos, pero a menudo se prescriben para aliviar los síntomas. Los medicamentos pueden ser útiles para lo siguiente:  · Reducción de la fiebre.  · Reducción de la tos.  · Alivio de la congestión nasal.    Siga estas instrucciones en su casa:  · Tome los medicamentos solamente como se lo haya indicado el médico.  · A fin de aliviar el dolor de garganta, haga gárgaras con solución salina templada o consuma caramelos para la tos según   lo que le haya indicado el médico.  · Use un humidificador con vapor cálido o inhale el vapor de la ducha para aumentar la humedad del aire. Esto facilitará la respiración.  · Beba suficiente líquido para mantener la orina clara o de color amarillo pálido.  · Consuma sopas y otros caldos transparentes, y aliméntese bien.  · Descanse todo lo que sea necesario.  · Regrese al trabajo cuando la temperatura se le haya normalizado o  según lo que le indique el médico. Es posible que deba quedarse en su casa durante un tiempo prolongado, para no infectar a los demás. También puede usar un barbijo y lavarse las manos con cuidado para evitar la propagación del virus.  · Aumente el uso del inhalador si tiene asma.  · No consuma ningún producto que contenga tabaco, lo que incluye cigarrillos, tabaco de mascar o cigarrillos electrónicos. Si necesita ayuda para dejar de fumar, consulte al médico.  ¿Cómo se evita?  La mejor manera de protegerse de un resfrío es con la práctica de una higiene adecuada.  · Evite el contacto por vía oral o a través de las manos con personas que tengan síntomas de resfrío.  · En caso de contacto, lávese las manos con frecuencia.    No hay pruebas claras de que la vitamina C, la vitamina E, la equinácea o el ejercicio reduzcan la probabilidad de contraer un resfrío. Sin embargo, siempre se recomienda descansar mucho, hacer ejercicio y alimentarse bien.  Comuníquese con un médico si:  · Su estado empeora en lugar de mejorar.  · Los medicamentos no logran controlar los síntomas.  · Tiene escalofríos.  · Experimenta un empeoramiento en la falta de aire.  · Tiene mucosidad marrón o roja.  · Tiene secreción nasal amarilla o marrón.  · Le duele la cara, especialmente al inclinarse hacia adelante.  · Tiene fiebre.  · Tiene los ganglios del cuello hinchados.  · Siente dolor al tragar.  · Tiene zonas blancas en la parte de atrás de la garganta.  Solicite ayuda de inmediato si:  · Tiene síntomas intensos o persistentes de:  ? Dolor de cabeza.  ? Dolor de oído.  ? Dolor sinusal.  ? Dolor en el pecho.  · Tiene enfermedad pulmonar crónica y cualquiera de estos síntomas:  ? Sibilancias.  ? Tos prolongada.  ? Tos con sangre.  ? Cambio en la mucosidad habitual.  · Presenta rigidez en el cuello.  · Tiene cambios en:  ? La visión.  ? La audición.  ? El pensamiento.  ? El estado de ánimo.  Esta información no tiene como fin reemplazar el  consejo del médico. Asegúrese de hacerle al médico cualquier pregunta que tenga.  Document Released: 08/25/2005 Document Revised: 03/02/2017 Document Reviewed: 02/20/2014  Elsevier Interactive Patient Education © 2018 [employer]

## 2018-01-05 ENCOUNTER — Ambulatory Visit: Payer: Self-pay | Admitting: Physician Assistant

## 2018-01-05 ENCOUNTER — Encounter: Payer: Self-pay | Admitting: Physician Assistant

## 2018-01-05 VITALS — BP 150/88 | HR 69 | Temp 97.9°F | Ht 61.0 in | Wt 149.0 lb

## 2018-01-05 DIAGNOSIS — I10 Essential (primary) hypertension: Secondary | ICD-10-CM

## 2018-01-05 DIAGNOSIS — E876 Hypokalemia: Secondary | ICD-10-CM

## 2018-01-05 MED ORDER — ATENOLOL 100 MG PO TABS: ORAL_TABLET | ORAL | 1 refills | Status: DC

## 2018-01-05 NOTE — Progress Notes (Signed)
   BP (!) 150/88 (BP Location: Left Arm, Patient Position: Sitting, Cuff Size: Normal)   Pulse 69   Temp 97.9 F (36.6 C)   Ht 5\' 1"  (1.549 m)   Wt 149 lb (67.6 kg)   SpO2 98%   BMI 28.15 kg/m    Subjective:    Patient ID: Karina Keller, female    DOB: [birth year], 52 y.o.   MRN: [id number]  HPI: Karina Keller is a 52 y.o. female presenting on 01/05/2018 for Hypertension   HPI   Pt feeling tired but otherwise well.   Relevant past medical, surgical, family and social history reviewed and updated as indicated. Interim medical history since our last visit reviewed. Allergies and medications reviewed and updated.   Current Outpatient Medications:  .  atenolol (TENORMIN) 50 MG tablet, 1 po qd.  Tome una tableta por boca diaria, Disp: 30 tablet, Rfl: 1   Review of Systems  Constitutional: Negative for appetite change, chills, diaphoresis, fatigue, fever and unexpected weight change.  HENT: Negative for congestion, dental problem, drooling, ear pain, facial swelling, hearing loss, mouth sores, sneezing, sore throat, trouble swallowing and voice change.   Eyes: Negative for pain, discharge, redness, itching and visual disturbance.  Respiratory: Negative for cough, choking, shortness of breath and wheezing.   Cardiovascular: Negative for chest pain, palpitations and leg swelling.  Gastrointestinal: Negative for abdominal pain, blood in stool, constipation, diarrhea and vomiting.  Endocrine: Negative for cold intolerance, heat intolerance and polydipsia.  Genitourinary: Negative for decreased urine volume, dysuria and hematuria.  Musculoskeletal: Negative for arthralgias, back pain and gait problem.  Skin: Negative for rash.  Allergic/Immunologic: Negative for environmental allergies.  Neurological: Negative for seizures, syncope, light-headedness and headaches.  Hematological: Negative for adenopathy.  Psychiatric/Behavioral: Negative for agitation, dysphoric mood and  suicidal ideas. The patient is not nervous/anxious.     Per HPI unless specifically indicated above     Objective:    BP (!) 150/88 (BP Location: Left Arm, Patient Position: Sitting, Cuff Size: Normal)   Pulse 69   Temp 97.9 F (36.6 C)   Ht 5\' 1"  (1.549 m)   Wt 149 lb (67.6 kg)   SpO2 98%   BMI 28.15 kg/m   Wt Readings from Last 3 Encounters:  01/05/18 149 lb (67.6 kg)  12/19/17 151 lb (68.5 kg)  12/05/17 150 lb (68 kg)    Physical Exam  Constitutional: She is oriented to person, place, and time. She appears well-developed and well-nourished.  HENT:  Head: Normocephalic and atraumatic.  Neck: Neck supple.  Cardiovascular: Normal rate and regular rhythm.  Pulmonary/Chest: Effort normal and breath sounds normal.  Abdominal: Soft. Bowel sounds are normal. She exhibits no mass. There is no hepatosplenomegaly. There is no tenderness.  Musculoskeletal: She exhibits no edema.  Lymphadenopathy:    She has no cervical adenopathy.  Neurological: She is alert and oriented to person, place, and time.  Skin: Skin is warm and dry.  Psychiatric: She has a normal mood and affect. Her behavior is normal.  Vitals reviewed.       Assessment & Plan:   Encounter Diagnoses  Name Primary?  . Essential hypertension Yes  . Hypokalemia      -will increase atenolol -Recheck K+ now that she is off her hctz for a month -pt to follow up in 1 month to recheck the bp.  RTO sooner prn

## 2018-01-06 ENCOUNTER — Other Ambulatory Visit (HOSPITAL_COMMUNITY)
Admission: RE | Admit: 2018-01-06 | Discharge: 2018-01-06 | Disposition: A | Discharge status: Home / Self Care | Payer: Self-pay | Source: Ambulatory Visit | Attending: Physician Assistant | Admitting: Physician Assistant

## 2018-01-06 DIAGNOSIS — I10 Essential (primary) hypertension: Secondary | ICD-10-CM

## 2018-01-06 DIAGNOSIS — E876 Hypokalemia: Secondary | ICD-10-CM

## 2018-01-06 LAB — POTASSIUM: Potassium: 3.2 mmol/L — ABNORMAL LOW (ref 3.5–5.1)

## 2018-01-09 ENCOUNTER — Other Ambulatory Visit: Payer: Self-pay | Admitting: Physician Assistant

## 2018-01-09 DIAGNOSIS — I10 Essential (primary) hypertension: Secondary | ICD-10-CM

## 2018-01-09 DIAGNOSIS — E876 Hypokalemia: Secondary | ICD-10-CM

## 2018-01-09 MED ORDER — POTASSIUM CHLORIDE ER 10 MEQ PO TBCR: EXTENDED_RELEASE_TABLET | ORAL | 0 refills | Status: DC

## 2018-02-06 ENCOUNTER — Ambulatory Visit: Payer: Self-pay | Admitting: Physician Assistant

## 2018-02-06 ENCOUNTER — Other Ambulatory Visit (HOSPITAL_COMMUNITY)
Admission: RE | Admit: 2018-02-06 | Discharge: 2018-02-06 | Disposition: A | Discharge status: Home / Self Care | Payer: Self-pay | Source: Ambulatory Visit | Attending: Physician Assistant | Admitting: Physician Assistant

## 2018-02-06 ENCOUNTER — Encounter: Payer: Self-pay | Admitting: Physician Assistant

## 2018-02-06 VITALS — BP 151/86 | HR 65 | Temp 97.7°F | Ht 61.0 in | Wt 148.0 lb

## 2018-02-06 DIAGNOSIS — Z91199 Patient's noncompliance with other medical treatment and regimen due to unspecified reason: Secondary | ICD-10-CM

## 2018-02-06 DIAGNOSIS — I10 Essential (primary) hypertension: Secondary | ICD-10-CM

## 2018-02-06 DIAGNOSIS — E876 Hypokalemia: Secondary | ICD-10-CM

## 2018-02-06 DIAGNOSIS — Z9119 Patient's noncompliance with other medical treatment and regimen: Secondary | ICD-10-CM

## 2018-02-06 LAB — BASIC METABOLIC PANEL
Anion gap: 9 (ref 5–15)
BUN: 14 mg/dL (ref 6–20)
CHLORIDE: 104 mmol/L (ref 101–111)
CO2: 27 mmol/L (ref 22–32)
CREATININE: 0.64 mg/dL (ref 0.44–1.00)
Calcium: 9 mg/dL (ref 8.9–10.3)
GFR calc non Af Amer: >60 mL/min (ref >60)
Glucose, Bld: 105 mg/dL — ABNORMAL HIGH (ref 65–99)
Potassium: 3.7 mmol/L (ref 3.5–5.1)
SODIUM: 140 mmol/L (ref 135–145)

## 2018-02-06 MED ORDER — ATENOLOL 100 MG PO TABS: ORAL_TABLET | ORAL | 1 refills | Status: DC

## 2018-02-06 NOTE — Progress Notes (Signed)
   BP (!) 151/86 (BP Location: Left Arm, Patient Position: Sitting, Cuff Size: Normal)   Pulse 65   Temp 97.7 F (36.5 C)   Ht 5\' 1"  (1.549 m)   Wt 148 lb (67.1 kg)   SpO2 97%   BMI 27.96 kg/m    Subjective:    Patient ID: Karina Keller, female    DOB: Dec 07, 1965, 52 y.o.   MRN: [id number]  HPI: Karina Keller is a 52 y.o. female presenting on 02/06/2018 for Hypertension   HPI  Pt ran out of her BP medication (atenolol) and didn't get it refilled.  Pt did not get labs drawn (to recheck K+)  Relevant past medical, surgical, family and social history reviewed and updated as indicated. Interim medical history since our last visit reviewed. Allergies and medications reviewed and updated.  CURRENT MEDS: none  Review of Systems  Constitutional: Negative for appetite change, chills, diaphoresis, fatigue, fever and unexpected weight change.  HENT: Negative for congestion, dental problem, drooling, ear pain, facial swelling, hearing loss, mouth sores, sneezing, sore throat, trouble swallowing and voice change.   Eyes: Negative for pain, discharge, redness, itching and visual disturbance.  Respiratory: Negative for cough, choking, shortness of breath and wheezing.   Cardiovascular: Negative for chest pain, palpitations and leg swelling.  Gastrointestinal: Negative for abdominal pain, blood in stool, constipation, diarrhea and vomiting.  Endocrine: Negative for cold intolerance, heat intolerance and polydipsia.  Genitourinary: Negative for decreased urine volume, dysuria and hematuria.  Musculoskeletal: Negative for arthralgias, back pain and gait problem.  Skin: Negative for rash.  Allergic/Immunologic: Negative for environmental allergies.  Neurological: Negative for seizures, syncope, light-headedness and headaches.  Hematological: Negative for adenopathy.  Psychiatric/Behavioral: Negative for agitation, dysphoric mood and suicidal ideas. The patient is not  nervous/anxious.     Per HPI unless specifically indicated above     Objective:    BP (!) 151/86 (BP Location: Left Arm, Patient Position: Sitting, Cuff Size: Normal)   Pulse 65   Temp 97.7 F (36.5 C)   Ht 5\' 1"  (1.549 m)   Wt 148 lb (67.1 kg)   SpO2 97%   BMI 27.96 kg/m   Wt Readings from Last 3 Encounters:  02/06/18 148 lb (67.1 kg)  01/05/18 149 lb (67.6 kg)  12/19/17 151 lb (68.5 kg)    Physical Exam  Constitutional: She is oriented to person, place, and time. She appears well-developed and well-nourished.  HENT:  Head: Normocephalic and atraumatic.  Neck: Neck supple.  Cardiovascular: Normal rate and regular rhythm.  Pulmonary/Chest: Effort normal and breath sounds normal.  Abdominal: Soft. Bowel sounds are normal. She exhibits no mass. There is no hepatosplenomegaly. There is no tenderness.  Musculoskeletal: She exhibits no edema.  Lymphadenopathy:    She has no cervical adenopathy.  Neurological: She is alert and oriented to person, place, and time.  Skin: Skin is warm and dry.  Psychiatric: She has a normal mood and affect. Her behavior is normal.  Vitals reviewed.       Assessment & Plan:   Encounter Diagnoses  Name Primary?  . Essential hypertension Yes  . Personal history of noncompliance with medical treatment, presenting hazards to health   . Hypokalemia     -pt counseled to Get labs today and -Get back on atenolol -Pt to follow up 4 weeks.  RTO sooner prn

## 2018-03-06 ENCOUNTER — Ambulatory Visit: Payer: Self-pay | Admitting: Physician Assistant

## 2018-03-13 ENCOUNTER — Other Ambulatory Visit (HOSPITAL_COMMUNITY)
Admission: RE | Admit: 2018-03-13 | Discharge: 2018-03-13 | Disposition: A | Discharge status: Home / Self Care | Payer: Self-pay | Source: Ambulatory Visit | Attending: Physician Assistant | Admitting: Physician Assistant

## 2018-03-13 DIAGNOSIS — R69 Illness, unspecified: Secondary | ICD-10-CM | POA: Insufficient documentation

## 2018-03-16 ENCOUNTER — Encounter: Payer: Self-pay | Admitting: Physician Assistant

## 2018-03-16 ENCOUNTER — Ambulatory Visit: Payer: Self-pay | Admitting: Physician Assistant

## 2018-03-16 VITALS — BP 146/86 | HR 63 | Temp 98.2°F | Ht 61.0 in | Wt 146.0 lb

## 2018-03-16 DIAGNOSIS — Z1239 Encounter for other screening for malignant neoplasm of breast: Secondary | ICD-10-CM

## 2018-03-16 DIAGNOSIS — I10 Essential (primary) hypertension: Secondary | ICD-10-CM

## 2018-03-16 MED ORDER — ATENOLOL 100 MG PO TABS: ORAL_TABLET | ORAL | 2 refills | Status: DC

## 2018-03-16 NOTE — Progress Notes (Signed)
BP (!) 146/86 (BP Location: Right Arm, Patient Position: Sitting, Cuff Size: Normal)   Pulse 63   Temp 98.2 F (36.8 C) (Oral)   Ht 5\' 1"  (1.549 m)   Wt 146 lb (66.2 kg)   SpO2 98%   BMI 27.59 kg/m    Subjective:    Patient ID: Karina Keller, female    DOB: [birth year], 52 y.o.   MRN: [id number]  HPI: Karina Keller is a 52 y.o. female presenting on 03/16/2018 for Hypertension   HPI   Pt is back on her atenolol.  She feels good.   Pt states last PAP - she says she is planning to update it here in June  She says [facility]  Relevant past medical, surgical, family and social history reviewed and updated as indicated. Interim medical history since our last visit reviewed. Allergies and medications reviewed and updated.   Current Outpatient Medications:  .  atenolol (TENORMIN) 100 MG tablet, 1 po qd for blood pressure.  Tome una tableta por boca diaria para el hipertension, Disp: 30 tablet, Rfl: 1  Review of Systems  Constitutional: Negative for appetite change, chills, diaphoresis, fatigue, fever and unexpected weight change.  HENT: Negative for congestion, dental problem, drooling, ear pain, facial swelling, hearing loss, mouth sores, sneezing, sore throat, trouble swallowing and voice change.   Eyes: Negative for pain, discharge, redness, itching and visual disturbance.  Respiratory: Negative for cough, choking, shortness of breath and wheezing.   Cardiovascular: Negative for chest pain, palpitations and leg swelling.  Gastrointestinal: Negative for abdominal pain, blood in stool, constipation, diarrhea and vomiting.  Endocrine: Negative for cold intolerance, heat intolerance and polydipsia.  Genitourinary: Negative for decreased urine volume, dysuria and hematuria.  Musculoskeletal: Negative for arthralgias, back pain and gait problem.  Skin: Negative for rash.  Allergic/Immunologic:  Negative for environmental allergies.  Neurological: Negative for seizures, syncope, light-headedness and headaches.  Hematological: Negative for adenopathy.  Psychiatric/Behavioral: Negative for agitation, dysphoric mood and suicidal ideas. The patient is not nervous/anxious.     Per HPI unless specifically indicated above     Objective:    BP (!) 146/86 (BP Location: Right Arm, Patient Position: Sitting, Cuff Size: Normal)   Pulse 63   Temp 98.2 F (36.8 C) (Oral)   Ht 5\' 1"  (1.549 m)   Wt 146 lb (66.2 kg)   SpO2 98%   BMI 27.59 kg/m   Wt Readings from Last 3 Encounters:  03/16/18 146 lb (66.2 kg)  02/06/18 148 lb (67.1 kg)  01/05/18 149 lb (67.6 kg)    Physical Exam  Constitutional: She is oriented to person, place, and time. She appears well-developed and well-nourished.  HENT:  Head: Normocephalic and atraumatic.  Neck: Neck supple.  Cardiovascular: Normal rate and regular rhythm.  Pulmonary/Chest: Effort normal and breath sounds normal.  Abdominal: Soft. Bowel sounds are normal. She exhibits no mass. There is no hepatosplenomegaly. There is no tenderness.  Musculoskeletal: She exhibits no edema.  Lymphadenopathy:    She has no cervical adenopathy.  Neurological: She is alert and oriented to person, place, and time.  Skin: Skin is warm and dry.  Psychiatric: She has a normal mood and affect. Her behavior is normal.  Vitals reviewed.   Results for orders placed or performed during the hospital encounter of 02/06/18  Basic metabolic panel  Result Value Ref Range   Sodium 140 135 - 145 mmol/L  Potassium 3.7 3.5 - 5.1 mmol/L   Chloride 104 101 - 111 mmol/L   CO2 27 22 - 32 mmol/L   Glucose, Bld 105 (H) 65 - 99 mg/dL   BUN 14 6 - 20 mg/dL   Creatinine, Ser 1.61 0.44 - 1.00 mg/dL   Calcium 9.0 8.9 - 09.6 mg/dL   GFR calc non Af Amer >60 >60 mL/min   GFR calc Af Amer >60 >60 mL/min   Anion gap 9 5 - 15      Assessment & Plan:   Encounter Diagnoses  Name  Primary?  . Essential hypertension Yes  . Screening for breast cancer     Will reorder mammogram Pt to continue current blood pressure medication Pt will follow up for PAP in 6 weeks.  RTO sooner prn

## 2018-03-23 ENCOUNTER — Other Ambulatory Visit: Payer: Self-pay | Admitting: Physician Assistant

## 2018-03-23 DIAGNOSIS — Z1231 Encounter for screening mammogram for malignant neoplasm of breast: Secondary | ICD-10-CM

## 2018-04-21 ENCOUNTER — Ambulatory Visit
Admission: RE | Admit: 2018-04-21 | Discharge: 2018-04-21 | Disposition: A | Discharge status: Home / Self Care | Payer: No Typology Code available for payment source | Source: Ambulatory Visit | Attending: Physician Assistant | Admitting: Physician Assistant

## 2018-04-21 DIAGNOSIS — Z1231 Encounter for screening mammogram for malignant neoplasm of breast: Secondary | ICD-10-CM

## 2018-04-27 ENCOUNTER — Other Ambulatory Visit (HOSPITAL_COMMUNITY)
Admission: RE | Admit: 2018-04-27 | Discharge: 2018-04-27 | Disposition: A | Discharge status: Home / Self Care | Payer: No Typology Code available for payment source | Source: Ambulatory Visit | Attending: Physician Assistant | Admitting: Physician Assistant

## 2018-04-27 ENCOUNTER — Ambulatory Visit: Payer: Self-pay | Admitting: Physician Assistant

## 2018-04-27 ENCOUNTER — Encounter: Payer: Self-pay | Admitting: Physician Assistant

## 2018-04-27 VITALS — BP 182/110 | HR 66 | Temp 97.9°F | Ht 61.0 in | Wt 146.8 lb

## 2018-04-27 DIAGNOSIS — E785 Hyperlipidemia, unspecified: Secondary | ICD-10-CM

## 2018-04-27 DIAGNOSIS — Z124 Encounter for screening for malignant neoplasm of cervix: Secondary | ICD-10-CM | POA: Insufficient documentation

## 2018-04-27 DIAGNOSIS — I10 Essential (primary) hypertension: Secondary | ICD-10-CM

## 2018-04-27 MED ORDER — AMLODIPINE BESYLATE 5 MG PO TABS: ORAL_TABLET | ORAL | 1 refills | Status: DC

## 2018-04-27 NOTE — Progress Notes (Signed)
BP (!) 182/110 (BP Location: Right Arm, Patient Position: Sitting, Cuff Size: Normal)   Pulse 66   Temp 97.9 F (36.6 C)   Ht  (1.549 m)   Wt 146 lb 12 oz (66.6 kg)   LMP 03/29/2017 (Approximate)   SpO2 98%   BMI 27.73 kg/m    Subjective:    Patient ID: Karina Keller, female    DOB: 1966-04-25, 52 y.o.   MRN: [id number]  HPI: Karina Keller is a 52 y.o. female presenting on 04/27/2018 for Gynecologic Exam and Hypertension   HPI   Pt is feeling well  Relevant past medical, surgical, family and social history reviewed and updated as indicated. Interim medical history since our last visit reviewed. Allergies and medications reviewed and updated.  CURRENT MEDS: Atenolol  qd   Review of Systems  Constitutional: Negative for appetite change, chills, diaphoresis, fatigue, fever and unexpected weight change.  HENT: Negative for congestion, dental problem, drooling, ear pain, facial swelling, hearing loss, mouth sores, sneezing, sore throat, trouble swallowing and voice change.   Eyes: Negative for pain, discharge, redness, itching and visual disturbance.  Respiratory: Negative for cough, choking, shortness of breath and wheezing.   Cardiovascular: Negative for chest pain, palpitations and leg swelling.  Gastrointestinal: Negative for abdominal pain, blood in stool, constipation, diarrhea and vomiting.  Endocrine: Negative for cold intolerance, heat intolerance and polydipsia.  Genitourinary: Negative for decreased urine volume, dysuria and hematuria.  Musculoskeletal: Negative for arthralgias, back pain and gait problem.  Skin: Negative for rash.  Allergic/Immunologic: Negative for environmental allergies.  Neurological: Negative for seizures, syncope, light-headedness and headaches.  Hematological: Negative for adenopathy.  Psychiatric/Behavioral: Negative for agitation, dysphoric mood and suicidal ideas. The patient is not nervous/anxious.     Per HPI  unless specifically indicated above     Objective:    BP (!) 182/110 (BP Location: Right Arm, Patient Position: Sitting, Cuff Size: Normal)   Pulse 66   Temp 97.9 F (36.6 C)   Ht  (1.549 m)   Wt 146 lb 12 oz (66.6 kg)   LMP 03/29/2017 (Approximate)   SpO2 98%   BMI 27.73 kg/m   Wt Readings from Last 3 Encounters:  04/27/18 146 lb 12 oz (66.6 kg)  03/16/18 146 lb (66.2 kg)  02/06/18 148 lb (67.1 kg)    Physical Exam  Constitutional: She is oriented to person, place, and time. She appears well-developed and well-nourished.  HENT:  Head: Normocephalic and atraumatic.  Neck: Neck supple.  Cardiovascular: Normal rate and regular rhythm.  Pulmonary/Chest: Effort normal and breath sounds normal. No breast tenderness, discharge or bleeding.  Breast exam normal  Abdominal: Soft. Bowel sounds are normal. She exhibits no mass. There is no hepatosplenomegaly. There is no tenderness. There is no rebound and no guarding.  Genitourinary: Vagina normal and uterus normal. There is no rash, tenderness or lesion on the right labia. There is no rash, tenderness or lesion on the left labia. Cervix exhibits no motion tenderness, no discharge and no friability. Right adnexum displays no mass, no tenderness and no fullness. Left adnexum displays no mass, no tenderness and no fullness.  Genitourinary Comments: (nurse Berenice assisted)  Musculoskeletal: She exhibits no edema.  Lymphadenopathy:    She has no cervical adenopathy.  Neurological: She is alert and oriented to person, place, and time.  Skin: Skin is warm and dry.  Psychiatric: She has a normal mood and affect. Her behavior is normal.  Nursing note and  vitals reviewed.        Assessment & Plan:   Encounter Diagnoses  Name Primary?  . Essential hypertension Yes  . Hyperlipidemia, unspecified hyperlipidemia type   . Routine Papanicolaou smear     -pt counseled on BSE -will Check cmp, lipids before next appt -Add amlodipine  for better BP control -pt to follow up 1 month to recheck blood pressure.  RTO sooner prn

## 2018-05-03 ENCOUNTER — Other Ambulatory Visit: Payer: Self-pay | Admitting: Physician Assistant

## 2018-05-04 ENCOUNTER — Other Ambulatory Visit: Payer: Self-pay | Admitting: Physician Assistant

## 2018-05-04 MED ORDER — METRONIDAZOLE 500 MG PO TABS: ORAL_TABLET | ORAL | 0 refills | Status: DC

## 2018-05-04 MED ORDER — ATENOLOL 100 MG PO TABS: ORAL_TABLET | ORAL | 2 refills | Status: DC

## 2018-05-22 ENCOUNTER — Other Ambulatory Visit (HOSPITAL_COMMUNITY)
Admission: RE | Admit: 2018-05-22 | Discharge: 2018-05-22 | Disposition: A | Discharge status: Home / Self Care | Payer: No Typology Code available for payment source | Source: Ambulatory Visit | Attending: Physician Assistant | Admitting: Physician Assistant

## 2018-05-22 DIAGNOSIS — I10 Essential (primary) hypertension: Secondary | ICD-10-CM | POA: Insufficient documentation

## 2018-05-22 DIAGNOSIS — E785 Hyperlipidemia, unspecified: Secondary | ICD-10-CM

## 2018-05-22 LAB — COMPREHENSIVE METABOLIC PANEL
ALT: 33 U/L (ref 14–54)
AST: 31 U/L (ref 15–41)
Albumin: 3.7 g/dL (ref 3.5–5.0)
Alkaline Phosphatase: 95 U/L (ref 38–126)
Anion gap: 7 (ref 5–15)
BILIRUBIN TOTAL: 0.6 mg/dL (ref 0.3–1.2)
BUN: 17 mg/dL (ref 6–20)
CHLORIDE: 107 mmol/L (ref 101–111)
CO2: 26 mmol/L (ref 22–32)
Calcium: 8.9 mg/dL (ref 8.9–10.3)
Creatinine, Ser: 0.51 mg/dL (ref 0.44–1.00)
GFR calc Af Amer: >60 mL/min (ref >60)
Glucose, Bld: 98 mg/dL (ref 65–99)
Potassium: 3.7 mmol/L (ref 3.5–5.1)
Sodium: 140 mmol/L (ref 135–145)
Total Protein: 7.7 g/dL (ref 6.5–8.1)

## 2018-05-22 LAB — LIPID PANEL
CHOLESTEROL: 195 mg/dL (ref 0–200)
HDL: 53 mg/dL (ref >40)
LDL CALC: 129 mg/dL — AB (ref 0–99)
TRIGLYCERIDES: 66 mg/dL (ref <150)
Total CHOL/HDL Ratio: 3.7 RATIO
VLDL: 13 mg/dL (ref 0–40)

## 2018-05-29 ENCOUNTER — Ambulatory Visit: Payer: Self-pay | Admitting: Physician Assistant

## 2018-05-29 ENCOUNTER — Encounter: Payer: Self-pay | Admitting: Physician Assistant

## 2018-05-29 VITALS — BP 136/84 | HR 71 | Temp 97.9°F | Ht 61.0 in | Wt 149.5 lb

## 2018-05-29 DIAGNOSIS — I10 Essential (primary) hypertension: Secondary | ICD-10-CM

## 2018-05-29 DIAGNOSIS — E785 Hyperlipidemia, unspecified: Secondary | ICD-10-CM

## 2018-05-29 MED ORDER — ATENOLOL 100 MG PO TABS: ORAL_TABLET | ORAL | 3 refills | Status: DC

## 2018-05-29 MED ORDER — AMLODIPINE BESYLATE 5 MG PO TABS: ORAL_TABLET | ORAL | 3 refills | Status: DC

## 2018-05-29 NOTE — Patient Instructions (Signed)
Colesterol  Cholesterol  El colesterol es una sustancia blanca, cerosa, similar a la grasa, que el cuerpo necesita en pequeñas cantidades. El hígado fabrica todo el colesterol que el cuerpo necesita. La sangre transporta el colesterol desde el hígado a través de los vasos sanguíneos. Los depósitos de colesterol (placas) podrían acumularse en las paredes de los vasos sanguíneos (arterias). Las placas provocan el estrechamiento y la rigidez de las arterias. Las placas de colesterol aumentan el riesgo de infarto de miocardio y accidente cerebrovascular.  Aunque sea muy elevado, la concentración de colesterol no puede percibirse. La única forma de saber que tiene colesterol alto es mediante un análisis de sangre. Una vez que se conocen las concentraciones de colesterol, se debe llevar un registro de los resultados de los análisis. Trabaje con el médico para mantener las concentraciones en el rango deseado.  ¿Qué significan los resultados?  · El colesterol total es una medida general de todo el colesterol en sangre.  · El colesterol LDL (lipoproteínas de baja densidad) es el colesterol “malo”. Este tipo es el que hace que se acumulen placas en las paredes de las arterias. Su concentración debe ser baja.  · El colesterol HDL (lipoproteínas de alta densidad) es el colesterol “bueno”, ya que limpia las arterias y arrastra el LDL. Su concentración debe ser alta.  · Los triglicéridos son grasas que el cuerpo puede quemar ya sea como fuente de energía o para almacenar. Las concentraciones altas están estrechamente vinculadas con las enfermedades cardíacas.  ¿Cuáles son las concentraciones de colesterol deseadas?  · El colesterol total debe estar por debajo de 200.  · Para las personas con riesgo, lo aconsejable es mantener el nivel de LDL por debajo de 100; y, para las personas con alto riesgo, es por debajo de 70.  · Se aconseja mantener un nivel de HDL es por encima de 40. Se considera  que un nivel de 60 o superior protege contra las enfermedades cardíacas.  · Los triglicéridos por debajo de 150.  ¿Cómo puedo bajar el colesterol?  Dieta  Siga el programa de alimentación que el médico le indique.  · Elija el pescado o la carne blanca de pollo y pavo, asados u horneados. Limite los cortes grasos de carne roja, los alimentos fritos y las carnes procesadas, como las salchichas y los embutidos.  · Coma gran cantidad de frutas y verduras frescas.  · Elija los cereales integrales, los frijoles, las pastas, las papas y los cereales.  · Elija aceite de oliva, aceite de maíz o aceite de canola, y solo use poca cantidad.  · No coma mantequilla, mayonesa, margarina o aceites de palmiste.  · Evite los alimentos que contengan grasas trans.  · Tome leche descremada o sin grasa y coma yogur y quesos descremados o sin grasa. Evite la leche entera, la crema, los helados, las yemas de huevo y los quesos enteros.  · Los postres sanos incluyen la torta ángel, los bocadillos de jengibre, las galletas con forma de animales, los caramelos duros, los helados de agua y el yogur helado descremado o semidescremado. Evite las masas, tortas, pasteles y galletas.    La práctica de actividad física.  · Siga el programa de ejercicios que le haya indicado el médico. Programa regular:  ? Ayuda a bajar el colesterol LDL y a aumentar el HDL.  ? Ayuda a controlar el peso.  · Haga cosas que aumenten el nivel de actividad; por ejemplo, haga los trabajos de jardinería, salga a caminar o use las   escaleras.  · Pregunte al médico sobre formas de aumentar la actividad en la vida diaria.    Medicamentos  · Tome los medicamentos de venta libre y los recetados solamente como se lo haya indicado el médico.  ? El médico puede recetarle medicamentos para ayudar a bajar el colesterol y reducir el riesgo de enfermedades cardíacas. Por lo general, esto se hace si con la dieta y la actividad física no se logra disminuir el nivel de colesterol.   ? Si tiene varios factores de riesgo, tal vez tenga que tomar medicamentos, incluso si las concentraciones son normales.    Esta información no tiene como fin reemplazar el consejo del médico. Asegúrese de hacerle al médico cualquier pregunta que tenga.  Document Released: 08/25/2005 Document Revised: 02/14/2017 Document Reviewed: 05/15/2016  Elsevier Interactive Patient Education © 2018 Elsevier Inc.

## 2018-05-29 NOTE — Progress Notes (Signed)
BP 136/84 (BP Location: Left Arm, Patient Position: Sitting, Cuff Size: Normal)   Pulse 71   Temp 97.9 F (36.6 C)   Ht 5\' 1"  (1.549 m)   Wt 149 lb 8 oz (67.8 kg)   SpO2 97%   BMI 28.25 kg/m    Subjective:    Patient ID: Karina Keller, female    DOB: 03/04/1966, 52 y.o.   MRN: [id number]  HPI: Karina Keller is a 52 y.o. female presenting on 05/29/2018 for Hypertension   HPI   Pt is doing well today  Relevant past medical, surgical, family and social history reviewed and updated as indicated. Interim medical history since our last visit reviewed. Allergies and medications reviewed and updated.   Current Outpatient Medications:  .  amLODipine (NORVASC) 5 MG tablet, 1 po qd for blood pressure.  Tome una tableta por boca diaria para el hipertension, Disp: 30 tablet, Rfl: 1 .  atenolol (TENORMIN) 100 MG tablet, 1 po qd for blood pressure.  Tome una tableta por boca diaria para el hipertension, Disp: 30 tablet, Rfl: 2  Review of Systems  Constitutional: Negative for appetite change, chills, diaphoresis, fatigue, fever and unexpected weight change.  HENT: Negative for congestion, dental problem, drooling, ear pain, facial swelling, hearing loss, mouth sores, sneezing, sore throat, trouble swallowing and voice change.   Eyes: Negative for pain, discharge, redness, itching and visual disturbance.  Respiratory: Negative for cough, choking, shortness of breath and wheezing.   Cardiovascular: Negative for chest pain, palpitations and leg swelling.  Gastrointestinal: Negative for abdominal pain, blood in stool, constipation, diarrhea and vomiting.  Endocrine: Negative for cold intolerance, heat intolerance and polydipsia.  Genitourinary: Negative for decreased urine volume, dysuria and hematuria.  Musculoskeletal: Negative for arthralgias, back pain and gait problem.  Skin: Negative for rash.  Allergic/Immunologic: Negative for environmental allergies.  Neurological: Negative  for seizures, syncope, light-headedness and headaches.  Hematological: Negative for adenopathy.  Psychiatric/Behavioral: Negative for agitation, dysphoric mood and suicidal ideas. The patient is not nervous/anxious.     Per HPI unless specifically indicated above     Objective:    BP 136/84 (BP Location: Left Arm, Patient Position: Sitting, Cuff Size: Normal)   Pulse 71   Temp 97.9 F (36.6 C)   Ht 5\' 1"  (1.549 m)   Wt 149 lb 8 oz (67.8 kg)   SpO2 97%   BMI 28.25 kg/m   Wt Readings from Last 3 Encounters:  05/29/18 149 lb 8 oz (67.8 kg)  04/27/18 146 lb 12 oz (66.6 kg)  03/16/18 146 lb (66.2 kg)    Physical Exam  Constitutional: She is oriented to person, place, and time. She appears well-developed and well-nourished.  HENT:  Head: Normocephalic and atraumatic.  Neck: Neck supple.  Cardiovascular: Normal rate and regular rhythm.  Pulmonary/Chest: Effort normal and breath sounds normal.  Abdominal: Soft. Bowel sounds are normal. She exhibits no mass. There is no hepatosplenomegaly. There is no tenderness.  Musculoskeletal: She exhibits no edema.  Lymphadenopathy:    She has no cervical adenopathy.  Neurological: She is alert and oriented to person, place, and time.  Skin: Skin is warm and dry.  Psychiatric: She has a normal mood and affect. Her behavior is normal.  Vitals reviewed.   Results for orders placed or performed during the hospital encounter of 05/22/18  Lipid panel  Result Value Ref Range   Cholesterol 195 0 - 200 mg/dL   Triglycerides 66 <409<150 mg/dL   HDL 53 >  40 mg/dL   Total CHOL/HDL Ratio 3.7 RATIO   VLDL 13 0 - 40 mg/dL   LDL Cholesterol 161 (H) 0 - 99 mg/dL  Comprehensive metabolic panel  Result Value Ref Range   Sodium 140 135 - 145 mmol/L   Potassium 3.7 3.5 - 5.1 mmol/L   Chloride 107 101 - 111 mmol/L   CO2 26 22 - 32 mmol/L   Glucose, Bld 98 65 - 99 mg/dL   BUN 17 6 - 20 mg/dL   Creatinine, Ser 0.96 0.44 - 1.00 mg/dL   Calcium 8.9 8.9 -  04.5 mg/dL   Total Protein 7.7 6.5 - 8.1 g/dL   Albumin 3.7 3.5 - 5.0 g/dL   AST 31 15 - 41 U/L   ALT 33 14 - 54 U/L   Alkaline Phosphatase 95 38 - 126 U/L   Total Bilirubin 0.6 0.3 - 1.2 mg/dL   GFR calc non Af Amer >60 >60 mL/min   GFR calc Af Amer >60 >60 mL/min   Anion gap 7 5 - 15      Assessment & Plan:    Encounter Diagnoses  Name Primary?  . Essential hypertension Yes  . Hyperlipidemia, unspecified hyperlipidemia type     -reviewed labs with pt -pt to continue current bp medication -counseled on lowfat diet and exercise for lipids -pt to follow up 3 months (no labs before appointment).  RTO sooner prn

## 2018-08-28 ENCOUNTER — Ambulatory Visit: Payer: No Typology Code available for payment source | Admitting: Physician Assistant

## 2018-08-28 ENCOUNTER — Encounter: Payer: Self-pay | Admitting: Physician Assistant

## 2018-08-28 ENCOUNTER — Other Ambulatory Visit: Payer: Self-pay | Admitting: Physician Assistant

## 2018-08-28 VITALS — BP 134/82 | HR 61 | Temp 97.7°F | Wt 155.0 lb

## 2018-08-28 DIAGNOSIS — I10 Essential (primary) hypertension: Secondary | ICD-10-CM

## 2018-08-28 DIAGNOSIS — K0889 Other specified disorders of teeth and supporting structures: Secondary | ICD-10-CM

## 2018-08-28 DIAGNOSIS — E785 Hyperlipidemia, unspecified: Secondary | ICD-10-CM

## 2018-08-28 DIAGNOSIS — Z1211 Encounter for screening for malignant neoplasm of colon: Secondary | ICD-10-CM

## 2018-08-28 MED ORDER — AMOXICILLIN 500 MG PO CAPS: ORAL_CAPSULE | ORAL | 0 refills | Status: DC

## 2018-08-28 MED ORDER — ATENOLOL 100 MG PO TABS: ORAL_TABLET | ORAL | 1 refills | Status: DC

## 2018-08-28 MED ORDER — AMLODIPINE BESYLATE 5 MG PO TABS: ORAL_TABLET | ORAL | 1 refills | Status: DC

## 2018-08-28 NOTE — Progress Notes (Signed)
BP 134/82 (BP Location: Right Arm, Patient Position: Sitting, Cuff Size: Normal)   Pulse 61   Temp 97.7 F (36.5 C)   Wt 155 lb (70.3 kg)   SpO2 99%   BMI 29.29 kg/m    Subjective:    Patient ID: Karina Keller, female    DOB: August 23, 1966, 52 y.o.   MRN: [id number]  HPI: Karina Keller is a 52 y.o. female presenting on 08/28/2018 for Hypertension   HPI   Pt is doing well except for she is having a lot of pain in her tooth.   She hasn't seen a dentist in about 4 years.  Relevant past medical, surgical, family and social history reviewed and updated as indicated. Interim medical history since our last visit reviewed. Allergies and medications reviewed and updated.   Current Outpatient Medications:  .  amLODipine (NORVASC) 5 MG tablet, 1 po qd for blood pressure.  Tome una tableta por boca diaria para el hipertension, Disp: 30 tablet, Rfl: 3 .  atenolol (TENORMIN) 100 MG tablet, 1 po qd for blood pressure.  Tome una tableta por boca diaria para el hipertension, Disp: 30 tablet, Rfl: 3   Review of Systems  Constitutional: Negative for appetite change, chills, diaphoresis, fatigue, fever and unexpected weight change.  HENT: Positive for dental problem. Negative for congestion, drooling, ear pain, facial swelling, hearing loss, mouth sores, sneezing, sore throat, trouble swallowing and voice change.   Eyes: Negative for pain, discharge, redness, itching and visual disturbance.  Respiratory: Negative for cough, choking, shortness of breath and wheezing.   Cardiovascular: Negative for chest pain, palpitations and leg swelling.  Gastrointestinal: Negative for abdominal pain, blood in stool, constipation, diarrhea and vomiting.  Endocrine: Negative for cold intolerance, heat intolerance and polydipsia.  Genitourinary: Negative for decreased urine volume, dysuria and hematuria.  Musculoskeletal: Negative for arthralgias, back pain and gait problem.  Skin: Negative for rash.    Allergic/Immunologic: Negative for environmental allergies.  Neurological: Negative for seizures, syncope, light-headedness and headaches.  Hematological: Negative for adenopathy.  Psychiatric/Behavioral: Negative for agitation, dysphoric mood and suicidal ideas. The patient is not nervous/anxious.     Per HPI unless specifically indicated above     Objective:    BP 134/82 (BP Location: Right Arm, Patient Position: Sitting, Cuff Size: Normal)   Pulse 61   Temp 97.7 F (36.5 C)   Wt 155 lb (70.3 kg)   SpO2 99%   BMI 29.29 kg/m   Wt Readings from Last 3 Encounters:  08/28/18 155 lb (70.3 kg)  05/29/18 149 lb 8 oz (67.8 kg)  04/27/18 146 lb 12 oz (66.6 kg)    Physical Exam  Constitutional: She is oriented to person, place, and time. She appears well-developed and well-nourished.  HENT:  Head: Normocephalic and atraumatic.  Mouth/Throat: Uvula is midline. No trismus in the jaw. Abnormal dentition. Dental caries present. No uvula swelling.    Tooth loose and tender with gum inflammation and tenderness surrounding the tooth.  No swelling of face seen  Neck: Neck supple.  Cardiovascular: Normal rate and regular rhythm.  Pulmonary/Chest: Effort normal and breath sounds normal.  Abdominal: Soft. Bowel sounds are normal. She exhibits no mass. There is no hepatosplenomegaly. There is no tenderness.  Musculoskeletal: She exhibits no edema.  Lymphadenopathy:    She has no cervical adenopathy.  Neurological: She is alert and oriented to person, place, and time.  Skin: Skin is warm and dry.  Psychiatric: She has a normal mood and affect.  Her behavior is normal.  Vitals reviewed.       Assessment & Plan:   Encounter Diagnoses  Name Primary?  . Essential hypertension Yes  . Hyperlipidemia, unspecified hyperlipidemia type   . Screening for colon cancer   . Dentalgia      -pt to continue current medication for blood pressure.   -pt is given amoxil for tooth and will be  referred to dentist -pt is given ifobt for colon cancer screening -pt to follow up 3 months.  RTO sooner prn

## 2018-09-13 ENCOUNTER — Encounter: Payer: Self-pay | Admitting: Physician Assistant

## 2018-09-13 ENCOUNTER — Ambulatory Visit: Payer: No Typology Code available for payment source | Admitting: Physician Assistant

## 2018-09-13 VITALS — BP 129/82 | HR 73 | Temp 97.9°F

## 2018-09-13 DIAGNOSIS — K047 Periapical abscess without sinus: Secondary | ICD-10-CM

## 2018-09-13 MED ORDER — AMOXICILLIN-POT CLAVULANATE 875-125 MG PO TABS: ORAL_TABLET | ORAL | 0 refills | Status: DC

## 2018-09-13 NOTE — Progress Notes (Signed)
   BP 129/82   Pulse 73   Temp 97.9 F (36.6 C)   SpO2 99%    Subjective:    Patient ID: Karina Keller, female    DOB: [birth year], 52 y.o.   MRN: [id number]  HPI: Karina Keller is a 52 y.o. female presenting on 09/13/2018 for No chief complaint on file.   HPI   Pt here for infection in face.  Pt c/o swelling in the face that started yesterday.  Pt was given amoxil for tooth infection 08/28/18.  She finished the antibiotic.  Face swelling only started yesterday.  Pt denies fevers.  Relevant past medical, surgical, family and social history reviewed and updated as indicated. Interim medical history since our last visit reviewed. Allergies and medications reviewed and updated.   CURRENT MEDS: Amlodipine atenolol    Review of Systems  Constitutional: Negative for appetite change, chills, diaphoresis, fatigue, fever and unexpected weight change.  HENT: Positive for dental problem and facial swelling. Negative for congestion, drooling, ear pain, hearing loss, mouth sores, sneezing, sore throat, trouble swallowing and voice change.   Eyes: Negative for pain, discharge, redness, itching and visual disturbance.  Respiratory: Negative for cough, choking, shortness of breath and wheezing.   Cardiovascular: Negative for chest pain, palpitations and leg swelling.  Gastrointestinal: Negative for abdominal pain, blood in stool, constipation, diarrhea and vomiting.  Endocrine: Negative for cold intolerance, heat intolerance and polydipsia.  Genitourinary: Negative for decreased urine volume, dysuria and hematuria.  Musculoskeletal: Negative for arthralgias, back pain and gait problem.  Skin: Negative for rash.  Allergic/Immunologic: Negative for environmental allergies.  Neurological: Negative for seizures, syncope, light-headedness and headaches.  Hematological: Negative for adenopathy.  Psychiatric/Behavioral: Negative for agitation, dysphoric mood and suicidal ideas. The  patient is not nervous/anxious.     Per HPI unless specifically indicated above     Objective:    BP 129/82   Pulse 73   Temp 97.9 F (36.6 C)   SpO2 99%   Wt Readings from Last 3 Encounters:  08/28/18 155 lb (70.3 kg)  05/29/18 149 lb 8 oz (67.8 kg)  04/27/18 146 lb 12 oz (66.6 kg)    Physical Exam  Constitutional: She is oriented to person, place, and time. She appears well-developed and well-nourished.  HENT:  Head: Normocephalic and atraumatic.  Mouth/Throat: Uvula is midline. No trismus in the jaw. Abnormal dentition. Dental abscesses and dental caries present. No uvula swelling.    There is swelled gingiva with draining small amount pus and teeth tender.  There is mild swelling in the face over the tooth area.  Speech is normal.  Pulmonary/Chest: Effort normal. No respiratory distress.  Neurological: She is alert and oriented to person, place, and time.  Skin: Skin is warm and dry.  Vitals reviewed.          Assessment & Plan:   Encounter Diagnosis  Name Primary?  . Dental abscess Yes     -Pt is told she needs to not work today because she works in [employer]. -rx augmentin -pt is secured a dental appointment for Oct 28.  Pt to RTO sooner if swelling worsens or she develops fevers

## 2018-09-13 NOTE — Patient Instructions (Signed)
Absceso dental Dental Abscess Un absceso dental es la acumulacin de pus en una pieza dental o alrededor de esta. Cules son las causas? La causa de la afeccin es una infeccin bacteriana alrededor de la raz de la pieza dental que compromete la parte interior del diente (pulpa dental). Puede presentarse como consecuencia de lo siguiente:  Caries importantes.  Traumatismo en el diente que permite el ingreso de bacterias a la pulpa, como una pieza dental rota o astillada.  Enfermedad grave de las encas alrededor de una pieza dental.  Cules son los signos o los sntomas? Los sntomas de esta afeccin incluyen lo siguiente:  Dolor intenso en la pieza dental infectada o alrededor de esta.  Hinchazn y enrojecimiento alrededor de la pieza dental infectada, en la boca o en el rostro.  Dolor a Insurance claims handler.  Secrecin de pus.  Mal aliento.  Sabor amargo en la boca.  Dificultad para tragar.  Dificultad para abrir Government social research officer.  Nuseas.  Vmitos.  Escalofros.  Ganglios del cuello inflamados.  Grant Ruts.  Cmo se diagnostica? Esta afeccin se diagnostica al examinar la pieza dental infectada. Durante el examen, el dentista puede darle golpecitos suaves en la pieza dental infectada. Adems, le har preguntas sobre sus antecedentes dentales y mdicos, y tal vez le indique que se haga radiografas. Cmo se trata? El tratamiento de la afeccin consiste en eliminar la infeccin. Esto se puede hacer de las siguientes maneras:  Antibiticos.  Un tratamiento de conductos, que puede realizarse para salvar la pieza dental.  La extraccin de la pieza dental que tambin puede incluir el drenaje del absceso. La extraccin se hace si no es posible salvar la pieza dental.  Siga estas instrucciones en su casa:  Tome los medicamentos solamente como se lo haya indicado el dentista.  Si le recetaron antibiticos, asegrese de terminarlos, incluso si comienza a Restaurant manager, fast food.  Enjuguese la boca (haga grgaras) frecuentemente con agua con sal para aliviar el dolor o la hinchazn.  No conduzca ni opere maquinaria pesada mientras toma analgsicos.  No se aplique calor en la parte externa de la boca.  Concurra a todas las visitas de control como se lo haya indicado el dentista. Esto es importante. Comunquese con un mdico si:  El dolor es ms intenso y no se alivia con los [employer]. Solicite ayuda de inmediato si:  Tiene fiebre o siente escalofros.  Los sntomas empeoran repentinamente.  Siente un dolor de cabeza muy intenso.  Tiene problemas para respirar o tragar.  Tiene dificultad para abrir Government social research officer.  Nota hinchazn en el cuello o alrededor del ojo. Esta informacin no tiene Theme park manager el consejo del mdico. Asegrese de hacerle al mdico cualquier pregunta que tenga. Document Released: 11/15/2005 Document Revised: 02/17/2017 Document Reviewed: 11/12/2014 Elsevier Interactive Patient Education  [employer].

## 2018-12-05 ENCOUNTER — Other Ambulatory Visit (HOSPITAL_COMMUNITY)
Admission: RE | Admit: 2018-12-05 | Discharge: 2018-12-05 | Disposition: A | Discharge status: Home / Self Care | Payer: No Typology Code available for payment source | Source: Ambulatory Visit | Attending: Physician Assistant | Admitting: Physician Assistant

## 2018-12-05 DIAGNOSIS — I10 Essential (primary) hypertension: Secondary | ICD-10-CM | POA: Insufficient documentation

## 2018-12-05 DIAGNOSIS — E785 Hyperlipidemia, unspecified: Secondary | ICD-10-CM | POA: Insufficient documentation

## 2018-12-05 LAB — LIPID PANEL
Cholesterol: 216 mg/dL — ABNORMAL HIGH (ref 0–200)
HDL: 57 mg/dL (ref >40)
LDL Cholesterol: 137 mg/dL — ABNORMAL HIGH (ref 0–99)
Total CHOL/HDL Ratio: 3.8 RATIO
Triglycerides: 109 mg/dL (ref <150)
VLDL: 22 mg/dL (ref 0–40)

## 2018-12-05 LAB — COMPREHENSIVE METABOLIC PANEL
ALK PHOS: 98 U/L (ref 38–126)
ALT: 32 U/L (ref 0–44)
AST: 32 U/L (ref 15–41)
Albumin: 3.9 g/dL (ref 3.5–5.0)
Anion gap: 4 — ABNORMAL LOW (ref 5–15)
BUN: 17 mg/dL (ref 6–20)
CALCIUM: 9.1 mg/dL (ref 8.9–10.3)
CO2: 25 mmol/L (ref 22–32)
CREATININE: 0.64 mg/dL (ref 0.44–1.00)
Chloride: 108 mmol/L (ref 98–111)
GFR calc non Af Amer: >60 mL/min (ref >60)
GLUCOSE: 97 mg/dL (ref 70–99)
Potassium: 3.4 mmol/L — ABNORMAL LOW (ref 3.5–5.1)
SODIUM: 137 mmol/L (ref 135–145)
Total Bilirubin: 0.9 mg/dL (ref 0.3–1.2)
Total Protein: 7.8 g/dL (ref 6.5–8.1)

## 2018-12-11 ENCOUNTER — Ambulatory Visit: Payer: No Typology Code available for payment source | Admitting: Physician Assistant

## 2018-12-11 ENCOUNTER — Encounter: Payer: Self-pay | Admitting: Physician Assistant

## 2018-12-11 VITALS — BP 106/70 | HR 76 | Temp 98.5°F | Wt 156.5 lb

## 2018-12-11 DIAGNOSIS — R3915 Urgency of urination: Secondary | ICD-10-CM

## 2018-12-11 DIAGNOSIS — E785 Hyperlipidemia, unspecified: Secondary | ICD-10-CM

## 2018-12-11 DIAGNOSIS — I10 Essential (primary) hypertension: Secondary | ICD-10-CM

## 2018-12-11 MED ORDER — SIMVASTATIN 20 MG PO TABS: ORAL_TABLET | ORAL | 4 refills | Status: DC

## 2018-12-11 NOTE — Progress Notes (Signed)
BP 106/70 (BP Location: Left Arm, Patient Position: Sitting, Cuff Size: Normal)   Pulse 76   Temp 98.5 F (36.9 C) (Oral)   Wt 156 lb 8 oz (71 kg)   SpO2 98%   BMI 29.57 kg/m    Subjective:    Patient ID: Karina Keller, female    DOB: 1966-05-03, 53 y.o.   MRN: [id number]  HPI: Karina Keller is a 53 y.o. female presenting on 12/11/2018 for Hypertension and Hyperlipidemia   HPI   Pt c/o urinary urgency.   Mostly at night, some during the day.  No dysuria.  Relevant past medical, surgical, family and social history reviewed and updated as indicated. Interim medical history since our last visit reviewed. Allergies and medications reviewed and updated.   Current Outpatient Medications:  .  amLODipine (NORVASC) 5 MG tablet, 1 po qd for blood pressure.  Tome una tableta por boca diaria para el hipertension, Disp: 90 tablet, Rfl: 1 .  atenolol (TENORMIN) 100 MG tablet, 1 po qd for blood pressure.  Tome una tableta por boca diaria para el hipertension, Disp: 90 tablet, Rfl: 1    Review of Systems  Constitutional: Negative for appetite change, chills, diaphoresis, fatigue, fever and unexpected weight change.  HENT: Negative for congestion, dental problem, drooling, ear pain, facial swelling, hearing loss, mouth sores, sneezing, sore throat, trouble swallowing and voice change.   Eyes: Negative for pain, discharge, redness, itching and visual disturbance.  Respiratory: Negative for cough, choking, shortness of breath and wheezing.   Cardiovascular: Negative for chest pain, palpitations and leg swelling.  Gastrointestinal: Negative for abdominal pain, blood in stool, constipation, diarrhea and vomiting.  Endocrine: Negative for cold intolerance, heat intolerance and polydipsia.  Genitourinary: Negative for decreased urine volume, dysuria and hematuria.  Musculoskeletal: Negative for arthralgias, back pain and gait problem.  Skin: Negative for rash.  Allergic/Immunologic:  Negative for environmental allergies.  Neurological: Negative for seizures, syncope, light-headedness and headaches.  Hematological: Negative for adenopathy.  Psychiatric/Behavioral: Negative for agitation, dysphoric mood and suicidal ideas. The patient is not nervous/anxious.     Per HPI unless specifically indicated above     Objective:    BP 106/70 (BP Location: Left Arm, Patient Position: Sitting, Cuff Size: Normal)   Pulse 76   Temp 98.5 F (36.9 C) (Oral)   Wt 156 lb 8 oz (71 kg)   SpO2 98%   BMI 29.57 kg/m   Wt Readings from Last 3 Encounters:  12/11/18 156 lb 8 oz (71 kg)  08/28/18 155 lb (70.3 kg)  05/29/18 149 lb 8 oz (67.8 kg)    Physical Exam Vitals signs reviewed.  Constitutional:      Appearance: She is well-developed.  HENT:     Head: Normocephalic and atraumatic.  Neck:     Musculoskeletal: Neck supple.  Cardiovascular:     Rate and Rhythm: Normal rate and regular rhythm.  Pulmonary:     Effort: Pulmonary effort is normal.     Breath sounds: Normal breath sounds.  Abdominal:     General: Bowel sounds are normal.     Palpations: Abdomen is soft. There is no mass.     Tenderness: There is no abdominal tenderness.  Lymphadenopathy:     Cervical: No cervical adenopathy.  Skin:    General: Skin is warm and dry.  Neurological:     Mental Status: She is alert and oriented to person, place, and time.  Psychiatric:  Behavior: Behavior normal.     Results for orders placed or performed during the hospital encounter of 12/05/18  Lipid panel  Result Value Ref Range   Cholesterol 216 (H) 0 - 200 mg/dL   Triglycerides 956 <387 mg/dL   HDL 57 >56 mg/dL   Total CHOL/HDL Ratio 3.8 RATIO   VLDL 22 0 - 40 mg/dL   LDL Cholesterol 433 (H) 0 - 99 mg/dL  Comprehensive metabolic panel  Result Value Ref Range   Sodium 137 135 - 145 mmol/L   Potassium 3.4 (L) 3.5 - 5.1 mmol/L   Chloride 108 98 - 111 mmol/L   CO2 25 22 - 32 mmol/L   Glucose, Bld 97 70 -  99 mg/dL   BUN 17 6 - 20 mg/dL   Creatinine, Ser 2.95 0.44 - 1.00 mg/dL   Calcium 9.1 8.9 - 18.8 mg/dL   Total Protein 7.8 6.5 - 8.1 g/dL   Albumin 3.9 3.5 - 5.0 g/dL   AST 32 15 - 41 U/L   ALT 32 0 - 44 U/L   Alkaline Phosphatase 98 38 - 126 U/L   Total Bilirubin 0.9 0.3 - 1.2 mg/dL   GFR calc non Af Amer >60 >60 mL/min   GFR calc Af Amer >60 >60 mL/min   Anion gap 4 (L) 5 - 15      Assessment & Plan:   Encounter Diagnoses  Name Primary?  . Essential hypertension Yes  . Hyperlipidemia, unspecified hyperlipidemia type   . Urinary urgency     -reviewed labs with pt -start statin. Counseled on lowfat diet and cholesterol -pt counseled to return iFOBT that was given to her in September for colon cancer screening -pt was unable to give sample for UA.  She is counseled to drink plenty of water, avoid soda, avoid drinking last 2 hours before bed -pt to follow up 3 months.   RTO sooner prn

## 2018-12-11 NOTE — Patient Instructions (Signed)
Colesterol Cholesterol El colesterol es una sustancia blanca, cerosa, similar a la grasa, que el cuerpo necesita en pequeas cantidades. El hgado fabrica todo el colesterol que el cuerpo necesita. La sangre transporta el colesterol desde el hgado a travs de los vasos sanguneos. Los depsitos de colesterol (placas) podran acumularse en las paredes de los vasos sanguneos (arterias). Las placas provocan el estrechamiento y la rigidez de las arterias. Las placas de colesterol aumentan el riesgo de infarto de miocardio y accidente cerebrovascular. Aunque sea muy elevado, la concentracin de colesterol no puede percibirse. La nica forma de saber que tiene colesterol alto es mediante un anlisis de sangre. Una vez que se conocen las concentraciones de colesterol, se debe llevar un registro de los resultados de los anlisis. Trabaje con el mdico para mantener las concentraciones en el rango deseado. Qu significan los resultados?  El colesterol total es una medida general de todo el colesterol en sangre.  El colesterol LDL (lipoprotenas de baja densidad) es el colesterol "malo". Este tipo es el que hace que se acumulen placas en las paredes de las arterias. Su concentracin debe ser baja.  El colesterol HDL (lipoprotenas de alta densidad) es el colesterol "bueno", ya que limpia las arterias y arrastra el LDL. Su concentracin debe ser alta.  Los triglicridos son grasas que el cuerpo puede quemar ya sea como fuente de energa o para almacenar. Las concentraciones altas estn estrechamente vinculadas con las enfermedades cardacas. Cules son las concentraciones de colesterol deseadas?  El colesterol total debe estar por debajo de 200.  Para las personas con riesgo, lo aconsejable es mantener el nivel de LDL por debajo de 100; y, para las personas con alto riesgo, es por debajo de 70.  Se aconseja mantener un nivel de HDL es por encima de 40. Se considera que un nivel de 60 o superior protege  contra las enfermedades cardacas.  Los triglicridos por debajo de 150. Cmo puedo bajar el colesterol? Dieta Siga el programa de alimentacin que el mdico le indique.  Elija el pescado o la carne blanca de pollo y pavo, asados u horneados. Limite los cortes grasos de carne roja, los alimentos fritos y las carnes procesadas, como las salchichas y los embutidos.  Coma gran cantidad de frutas y verduras frescas.  Elija los cereales integrales, los frijoles, las pastas, las papas y los cereales.  Elija aceite de oliva, aceite de maz o aceite de canola, y solo use poca cantidad.  No coma mantequilla, mayonesa, margarina o aceites de palmiste.  Evite los alimentos que contengan grasas trans.  Tome leche descremada o sin grasa y coma yogur y quesos descremados o sin grasa. Evite la leche entera, la crema, los helados, las yemas de huevo y los quesos enteros.  Los postres sanos incluyen la torta ngel, los bocadillos de jengibre, las galletas con forma de animales, los caramelos duros, los helados de agua y el yogur helado descremado o semidescremado. Evite las masas, tortas, pasteles y galletas.  La prctica de actividad fsica.  Siga el programa de ejercicios que le haya indicado el mdico. Programa regular: ? Ayuda a bajar el colesterol LDL y a aumentar el HDL. ? Ayuda a controlar el peso.  Haga cosas que aumenten el nivel de actividad; por ejemplo, haga los trabajos de jardinera, salga a caminar o use las escaleras.  Pregunte al mdico sobre formas de aumentar la actividad en la vida diaria. Medicamentos  Tome los medicamentos de venta libre y los recetados solamente como se lo   haya indicado el mdico. ? El mdico puede recetarle medicamentos para ayudar a bajar el colesterol y reducir el riesgo de enfermedades cardacas. Por lo general, esto se hace si con la dieta y la actividad fsica no se logra disminuir el nivel de colesterol. ? Si tiene varios factores de riesgo, tal vez  tenga que tomar medicamentos, incluso si las concentraciones son normales. Esta informacin no tiene como fin reemplazar el consejo del mdico. Asegrese de hacerle al mdico cualquier pregunta que tenga. Document Released: 08/25/2005 Document Revised: 02/14/2017 Document Reviewed: 05/15/2016 Elsevier Interactive Patient Education  2019 Elsevier Inc.  

## 2019-03-19 ENCOUNTER — Encounter: Payer: Self-pay | Admitting: Physician Assistant

## 2019-03-19 ENCOUNTER — Ambulatory Visit: Payer: No Typology Code available for payment source | Admitting: Physician Assistant

## 2019-03-19 DIAGNOSIS — E785 Hyperlipidemia, unspecified: Secondary | ICD-10-CM

## 2019-03-19 DIAGNOSIS — I10 Essential (primary) hypertension: Secondary | ICD-10-CM

## 2019-03-19 DIAGNOSIS — Z789 Other specified health status: Secondary | ICD-10-CM

## 2019-03-19 MED ORDER — AMLODIPINE BESYLATE 5 MG PO TABS: ORAL_TABLET | ORAL | 1 refills | Status: DC

## 2019-03-19 NOTE — Progress Notes (Signed)
There were no vitals taken for this visit.   Subjective:    Patient ID: Karina Keller, female    DOB: January 16, 1966, 53 y.o.   MRN: [id number]  HPI: Karina Keller is a 53 y.o. female presenting on 03/19/2019 for No chief complaint on file.   HPI   This is a telemedicine visit through [facility] as pt was unable to get connected through [facility]- telemedicine visit due to coronavirus pandemic  I connected with  Karina Keller on 03/19/19 by a video enabled telemedicine application and verified that I am speaking with the correct person using two identifiers.   I discussed the limitations of evaluation and management by telemedicine. The patient expressed understanding and agreed to proceed.    Pt is doing well.  No complaints exept for some pains mostly at night while in the bed.  She says pains on chest wall that feels better when she massages the area.  She has had no SOB, nausea or diaphoresis.  She says the pain usually lasts about 20 minutes.  It does not awaken her from sleep.  She is not having the pains with exertion.  Pt says she does not eat later than pm so she doesn't think it is related to food/eating.   The pain does not radiate to extremities or neck.   She says she will get the pain maybe one or two nights/week.    Relevant past medical, surgical, family and social history reviewed and updated as indicated. Interim medical history since our last visit reviewed. Allergies and medications reviewed and updated.    Current Outpatient Medications:  .  amLODipine (NORVASC) 5 MG tablet, 1 po qd for blood pressure.  Tome una tableta por boca diaria para el hipertension, Disp: 90 tablet, Rfl: 1 .  atenolol (TENORMIN) 100 MG tablet, 1 po qd for blood pressure.  Tome una tableta por boca diaria para el hipertension, Disp: 90 tablet, Rfl: 1 .  simvastatin (ZOCOR) 20 MG tablet, 1 po qhs.  Tome una tableta por boca al dormir, Disp: 30 tablet, Rfl: 4   Review of  Systems  Per HPI unless specifically indicated above     Objective:    There were no vitals taken for this visit.  Wt Readings from Last 3 Encounters:  12/11/18 156 lb 8 oz (71 kg)  08/28/18 155 lb (70.3 kg)  05/29/18 149 lb 8 oz (67.8 kg)    Physical Exam Constitutional:      General: She is not in acute distress.    Appearance: She is not ill-appearing or toxic-appearing.  HENT:     Head: Normocephalic and atraumatic.  Pulmonary:     Effort: Pulmonary effort is normal. No respiratory distress.  Neurological:     Mental Status: She is alert and oriented to person, place, and time.  Psychiatric:        Mood and Affect: Mood normal.        Behavior: Behavior normal.         Assessment & Plan:    Encounter Diagnoses  Name Primary?  . Essential hypertension Yes  . Hyperlipidemia, unspecified hyperlipidemia type   . Not proficient in [place] language      -will Defer labs at this time due to CV19  -pt is to Continue current rx  -Will see pt back in office in 6 week to recheck pains.  Will update labs at that time.  Pt is counseled to contact office for any worsening  or the pains, changes in symptoms or any new symptoms.    She is counseled to try some warm compresses and/or IBU

## 2019-05-15 ENCOUNTER — Ambulatory Visit: Payer: No Typology Code available for payment source | Admitting: Physician Assistant

## 2019-05-17 ENCOUNTER — Ambulatory Visit: Payer: No Typology Code available for payment source | Admitting: Physician Assistant

## 2019-05-17 ENCOUNTER — Encounter: Payer: Self-pay | Admitting: Physician Assistant

## 2019-05-17 DIAGNOSIS — I10 Essential (primary) hypertension: Secondary | ICD-10-CM

## 2019-05-17 DIAGNOSIS — E785 Hyperlipidemia, unspecified: Secondary | ICD-10-CM

## 2019-05-17 DIAGNOSIS — Z789 Other specified health status: Secondary | ICD-10-CM

## 2019-05-17 DIAGNOSIS — K219 Gastro-esophageal reflux disease without esophagitis: Secondary | ICD-10-CM

## 2019-05-17 NOTE — Patient Instructions (Addendum)
Can use over the counter pepcid or prilosec Puede usar pepcid o prilosec sin receta   ---------------------------------------------------------------------------     Enfermedad de reflujo gastroesofgico en los adultos Gastroesophageal Reflux Disease, Adult El reflujo gastroesofgico (RGE) ocurre cuando el cido del estmago sube por el tubo que conecta la boca con el estmago (esfago). Normalmente, la comida baja por el esfago y se mantiene en el estmago, donde se la digiere. Cuando una persona tiene RGE, los alimentos y el cido estomacal suelen volver al esfago. Usted puede tener una enfermedad llamada enfermedad de reflujo gastroesofgico (ERGE) si el reflujo:  Sucede a menudo.  Causa sntomas frecuentes o muy intensos.  Causa problemas tales como dao en el esfago. Cuando esto ocurre, el esfago duele y se hincha (inflama). Con el tiempo, la ERGE puede ocasionar pequeos agujeros (lceras) en el revestimiento del esfago. Cules son las causas? Esta afeccin se debe a un problema en el msculo que se encuentra entre el esfago y [place]. Cuando este msculo est dbil o no es normal, no se cierra correctamente para impedir que los alimentos y el cido regresen del Paramedic. El msculo puede debilitarse debido a lo siguiente:  El consumo de [place].  [place].  Tener cierto tipo de hernia (hernia de hiato).  Consumo de alcohol.  Ciertos alimentos y bebidas, como caf, chocolate, cebollas y [place]. Qu incrementa el riesgo? Es ms probable que tenga esta afeccin si:  Tiene sobrepeso.  Tiene una enfermedad que afecta el tejido conjuntivo.  [place] antiinflamatorios no esteroideos (AINE). Cules son los signos o los sntomas? Los sntomas de esta afeccin incluyen:  Acidez estomacal.  Dificultad o dolor al tragar.  Sensacin de Best boy un bulto en la garganta.  Sabor amargo en la boca.  Mal aliento.  Tener una gran cantidad de saliva.  Estmago inflamado o  con Tree surgeon.  Eructos.  Dolor en el pecho. El dolor de pecho puede deberse a distintas afecciones. Asegrese de Teacher, adult education a su mdico si tiene Tourist information centre manager.  Falta de aire o respiracin ruidosa (sibilancias).  Tos constante (crnica) o durante la noche.  Desgaste de la superficie de los dientes (esmalte dental).  Prdida de peso. Cmo se trata? El tratamiento depender de la gravedad de los sntomas. El mdico puede sugerirle lo siguiente:  Cambios en la dieta.  Medicamentos.  Clementeen Hoof. Siga estas indicaciones en su casa: Comida y bebida   Siga una dieta como se lo haya indicado el mdico. Es posible que deba evitar alimentos y bebidas, por ejemplo: ? Caf y t (con o sin cafena). ? Bebidas que contengan alcohol. ? Bebidas energticas y deportivas. ? Bebidas gaseosas y refrescos. ? Chocolate y cacao. ? Menta y [place]. ? Ajo y cebolla. ? Rbano picante. ? Alimentos cidos y condimentados. Estos incluyen todos los tipos de pimientos, [place] en polvo, curry en polvo, vinagre, salsas picantes y [employer]. ? Ctricos y sus jugos, por ejemplo, naranjas, limones y limas. ? Alimentos que [employer]. Estos incluyen salsa roja, [place], salsa picante y pizza con salsa de [place]. ? Alimentos fritos y Radio broadcast assistant. Estos incluyen donas, papas fritas, papitas fritas de bolsa y aderezos con alto contenido de [place]. ? Carnes con alto contenido de [place]. Estas incluye los perros calientes, chuletas o costillas, embutidos, jamn y tocino. ? Productos lcteos ricos en grasas, como leche [place], [place] y [place] crema.  Consuma pequeas cantidades de comida con ms frecuencia. Evite consumir porciones abundantes.  Evite beber grandes cantidades de lquidos con  las comidas.  Evite comer 2 o 3horas antes de acostarse.  Evite recostarse inmediatamente despus de comer.  No haga ejercicios enseguida despus de comer. Estilo de vida   No consuma ningn producto que  contenga nicotina o tabaco. Estos incluyen cigarrillos, cigarrillos electrnicos y tabaco para Theatre managermascar. Si necesita ayuda para dejar de fumar, consulte al American Expressmdico.  Intente reducir J. C. Penneyel nivel de estrs. Si necesita ayuda para hacer esto, consulte al mdico.  Si tiene sobrepeso, baje una cantidad de peso saludable para usted. Consulte a su mdico para bajar de peso de MetLifemanera segura. Indicaciones generales  Est atento a cualquier cambio en los sntomas.  Tome los medicamentos de venta libre y los recetados solamente como se lo haya indicado el mdico. No tome aspirina, ibuprofeno ni otros AINE a menos que el mdico lo autorice.  Use ropa holgada. No use nada apretado alrededor de la cintura.  Levante (eleve) la cabecera de la cama aproximadamente 6pulgadas (15cm).  Evite inclinarse si al hacerlo empeoran los sntomas.  Concurra a todas las visitas de [address] Eld[address] Parkwayseguimiento como se lo haya indicado el mdico. Esto es importante. Comunquese con un mdico si:  Aparecen nuevos sntomas.  Adelgaza y no sabe por qu.  Tiene problemas para tragar o le duele cuando traga.  Tiene sibilancias o tos persistente.  Los sntomas no mejoran con Scientist, research (medical)el tratamiento.  Tiene la voz ronca. Solicite ayuda inmediatamente si:  Goldman SachsSiente dolor en los brazos, el cuello, la [place]mandbula, los dientes o la espalda.  Se siente transpirado, mareado o tiene una sensacin de desvanecimiento.  Siente falta de aire o Journalist, newspaperdolor en el pecho.  Vomita y el vmito tiene un aspecto similar a la sangre o a los posos de caf.  Pierde el conocimiento (se desmaya).  Las deposiciones (heces) son sanguinolentas o negras.  No puede tragar, beber o comer. Resumen  Si una persona tiene enfermedad de reflujo gastroesofgico (ERGE), los alimentos y el cido estomacal suben al esfago y causan sntomas o problemas tales como dao en el esfago.  El tratamiento depender de la gravedad de los sntomas.  Siga una Air traffic controllerdieta como se lo haya indicado  el mdico.  Tome todos los medicamentos solamente como se lo haya indicado el mdico. Esta informacin no tiene Theme park managercomo fin reemplazar el consejo del mdico. Asegrese de hacerle al mdico cualquier pregunta que tenga. Document Released: 12/18/2010 Document Revised: 06/29/2018 Document Reviewed: 06/29/2018 Elsevier Interactive Patient Education  2019 [employer]vier Inc.

## 2019-05-17 NOTE — Progress Notes (Signed)
   There were no vitals taken for this visit.   Subjective:    Patient ID: Karina Keller, female    DOB: July 05, 1966, 53 y.o.   MRN: [id number]  HPI: Karina Keller is a 53 y.o. female presenting on 05/17/2019 for No chief complaint on file.   HPI   This is at telemedicine visit due to coronavirus pandemic.  It is via Telephone as pt could not get her video to connect  I connected with  Geraldo Pitter on 05/17/19 by a video enabled telemedicine application and verified that I am speaking with the correct person using two identifiers.   I discussed the limitations of evaluation and management by telemedicine. The patient expressed understanding and agreed to proceed.  Pt is at home.  Provider is at office  Pt did not get her blood labs drawn  Pt works at [employer]   She says she wears a mask while at work  Pt says her throat hurts at times. She feels like she has fluid coming up from her stomach.   She has tried nothing except milk to try to help.  She says she is otherwise doing well  Relevant past medical, surgical, family and social history reviewed and updated as indicated. Interim medical history since our last visit reviewed. Allergies and medications reviewed and updated.    Current Outpatient Medications:  .  amLODipine (NORVASC) 5 MG tablet, 1 po qd for blood pressure.  Tome una tableta por boca diaria para el hipertension, Disp: 90 tablet, Rfl: 1 .  atenolol (TENORMIN) 100 MG tablet, 1 po qd for blood pressure.  Tome una tableta por boca diaria para el hipertension, Disp: 90 tablet, Rfl: 1 .  simvastatin (ZOCOR) 20 MG tablet, 1 po qhs.  Tome una tableta por boca al dormir, Disp: 30 tablet, Rfl: 4   Review of Systems  Per HPI unless specifically indicated above     Objective:    There were no vitals taken for this visit.  Wt Readings from Last 3 Encounters:  12/11/18 156 lb 8 oz (71 kg)  08/28/18 155 lb (70.3 kg)  05/29/18 149 lb 8 oz (67.8 kg)    Physical Exam Pulmonary:     Effort: No respiratory distress.  Neurological:     Mental Status: She is alert and oriented to person, place, and time.  Psychiatric:        Attention and Perception: Attention normal.        Speech: Speech normal.        Behavior: Behavior is cooperative.        Cognition and Memory: Cognition normal.           Assessment & Plan:    Encounter Diagnoses  Name Primary?  . Essential hypertension Yes  . Hyperlipidemia, unspecified hyperlipidemia type   . Not proficient in [place] language   . Gastroesophageal reflux disease, esophagitis presence not specified      -pt to get fasting labs .  Will call with results -pt to continue current medictaions -recommended pt try OTC prilosec or pepcid prn.  Counseled to avoid spicy foods -pt to follow up in 3 months.  She is to contact office sooner prn

## 2019-06-03 ENCOUNTER — Other Ambulatory Visit: Payer: Self-pay | Admitting: Physician Assistant

## 2019-06-04 ENCOUNTER — Other Ambulatory Visit: Payer: Self-pay | Admitting: Physician Assistant

## 2019-06-04 MED ORDER — SIMVASTATIN 20 MG PO TABS: ORAL_TABLET | ORAL | 4 refills | Status: DC

## 2019-06-04 MED ORDER — ATENOLOL 100 MG PO TABS: ORAL_TABLET | ORAL | 3 refills | Status: DC

## 2019-06-04 MED ORDER — AMLODIPINE BESYLATE 5 MG PO TABS: ORAL_TABLET | ORAL | 3 refills | Status: DC

## 2019-07-04 ENCOUNTER — Other Ambulatory Visit: Payer: Self-pay

## 2019-07-04 DIAGNOSIS — Z20822 Contact with and (suspected) exposure to covid-19: Secondary | ICD-10-CM

## 2019-07-05 LAB — NOVEL CORONAVIRUS, NAA: SARS-CoV-2, NAA: NOT DETECTED

## 2019-07-09 ENCOUNTER — Telehealth: Payer: Self-pay | Admitting: *Deleted

## 2019-07-09 NOTE — Telephone Encounter (Signed)
Patient's husband called for results- notified negative COVID result. Advised follow up for symptoms if needed.

## 2019-08-15 ENCOUNTER — Ambulatory Visit: Payer: No Typology Code available for payment source | Admitting: Physician Assistant

## 2019-08-21 ENCOUNTER — Ambulatory Visit: Payer: No Typology Code available for payment source | Admitting: Physician Assistant

## 2019-08-27 ENCOUNTER — Ambulatory Visit: Payer: No Typology Code available for payment source | Admitting: Physician Assistant

## 2019-08-28 ENCOUNTER — Other Ambulatory Visit (HOSPITAL_COMMUNITY)
Admission: RE | Admit: 2019-08-28 | Discharge: 2019-08-28 | Disposition: A | Discharge status: Home / Self Care | Payer: No Typology Code available for payment source | Source: Ambulatory Visit | Attending: Physician Assistant | Admitting: Physician Assistant

## 2019-08-28 DIAGNOSIS — E785 Hyperlipidemia, unspecified: Secondary | ICD-10-CM

## 2019-08-28 DIAGNOSIS — I10 Essential (primary) hypertension: Secondary | ICD-10-CM | POA: Insufficient documentation

## 2019-08-28 LAB — COMPREHENSIVE METABOLIC PANEL
ALT: 31 U/L (ref 0–44)
AST: 32 U/L (ref 15–41)
Albumin: 3.8 g/dL (ref 3.5–5.0)
Alkaline Phosphatase: 105 U/L (ref 38–126)
Anion gap: 9 (ref 5–15)
BUN: 11 mg/dL (ref 6–20)
CO2: 26 mmol/L (ref 22–32)
Calcium: 8.8 mg/dL — ABNORMAL LOW (ref 8.9–10.3)
Chloride: 103 mmol/L (ref 98–111)
Creatinine, Ser: 0.57 mg/dL (ref 0.44–1.00)
GFR calc Af Amer: >60 mL/min (ref >60)
GFR calc non Af Amer: >60 mL/min (ref >60)
Glucose, Bld: 103 mg/dL — ABNORMAL HIGH (ref 70–99)
Potassium: 3.4 mmol/L — ABNORMAL LOW (ref 3.5–5.1)
Sodium: 138 mmol/L (ref 135–145)
Total Bilirubin: 1 mg/dL (ref 0.3–1.2)
Total Protein: 7.3 g/dL (ref 6.5–8.1)

## 2019-08-28 LAB — LIPID PANEL
Cholesterol: 218 mg/dL — ABNORMAL HIGH (ref 0–200)
HDL: 52 mg/dL (ref >40)
LDL Cholesterol: 144 mg/dL — ABNORMAL HIGH (ref 0–99)
Total CHOL/HDL Ratio: 4.2 RATIO
Triglycerides: 108 mg/dL (ref <150)
VLDL: 22 mg/dL (ref 0–40)

## 2019-09-04 ENCOUNTER — Ambulatory Visit: Payer: No Typology Code available for payment source | Admitting: Physician Assistant

## 2019-10-02 ENCOUNTER — Other Ambulatory Visit: Payer: Self-pay | Admitting: Physician Assistant

## 2019-10-02 ENCOUNTER — Ambulatory Visit: Payer: No Typology Code available for payment source | Admitting: Physician Assistant

## 2019-10-02 ENCOUNTER — Other Ambulatory Visit: Payer: Self-pay

## 2019-10-02 ENCOUNTER — Encounter: Payer: Self-pay | Admitting: Physician Assistant

## 2019-10-02 VITALS — BP 100/70 | HR 63 | Temp 97.7°F | Wt 158.1 lb

## 2019-10-02 DIAGNOSIS — Z1211 Encounter for screening for malignant neoplasm of colon: Secondary | ICD-10-CM

## 2019-10-02 DIAGNOSIS — I10 Essential (primary) hypertension: Secondary | ICD-10-CM

## 2019-10-02 DIAGNOSIS — R079 Chest pain, unspecified: Secondary | ICD-10-CM

## 2019-10-02 DIAGNOSIS — E785 Hyperlipidemia, unspecified: Secondary | ICD-10-CM

## 2019-10-02 DIAGNOSIS — Z789 Other specified health status: Secondary | ICD-10-CM

## 2019-10-02 DIAGNOSIS — K219 Gastro-esophageal reflux disease without esophagitis: Secondary | ICD-10-CM

## 2019-10-02 DIAGNOSIS — Z1239 Encounter for other screening for malignant neoplasm of breast: Secondary | ICD-10-CM

## 2019-10-02 MED ORDER — ATORVASTATIN CALCIUM 20 MG PO TABS: ORAL_TABLET | ORAL | 4 refills | Status: DC

## 2019-10-02 MED ORDER — AMLODIPINE BESYLATE 5 MG PO TABS: ORAL_TABLET | ORAL | 4 refills | Status: DC

## 2019-10-02 MED ORDER — METOPROLOL TARTRATE 50 MG PO TABS: ORAL_TABLET | ORAL | 3 refills | Status: DC

## 2019-10-02 MED ORDER — PANTOPRAZOLE SODIUM 40 MG PO TBEC: DELAYED_RELEASE_TABLET | ORAL | 3 refills | Status: DC

## 2019-10-02 MED ORDER — ATENOLOL 100 MG PO TABS: ORAL_TABLET | ORAL | 4 refills | Status: DC

## 2019-10-02 NOTE — Progress Notes (Signed)
BP 100/70   Pulse 63   Temp 97.7 F (36.5 C)   Wt 158 lb 1.6 oz (71.7 kg)   SpO2 98%   BMI 29.87 kg/m    Subjective:    Patient ID: Karina Keller, female    DOB: Apr 17, 1966, 53 y.o.   MRN: [id number]  HPI: Karina Keller is a 53 y.o. female presenting on 10/02/2019 for Hypertension and Hyperlipidemia   HPI  Patient had a negative COVID-19 screening questionnaire.  Pt stopped her simvastatin because she felt like it was causing her to urinate more frequently and she though it was making her gain weight.   She sometimes gets CP.    She says the pain comes suddently while walking while she is at work.   She has noticed it when they are busy at work.  She says that sometimes she is carrying plates and sometimes she is not.  She also notices it at home sometimes.    Last occurrence was on Thursday.  She was at work then.  The pain lasts 1 or 2 minutes.  No associated sob, diaphoresis or nausea.    When she gets the pain at home, it is when she is laying down in the bed at night.     Pt does report acid at times.  Hx GERD.   She was put on omeprazole in the past but she discontinued it saying that she preferred to use Maalox when she was symptomatic instead of using the omeprazole on a daily basis.   Pt was given ifobt for colon cancer screening in september 2019 that she never returned.     Relevant past medical, surgical, family and social history reviewed and updated as indicated. Interim medical history since our last visit reviewed. Allergies and medications reviewed and updated.   Current Outpatient Medications:  .  amLODipine (NORVASC) 5 MG tablet, 1 po qd for blood pressure.  Tome una tableta por boca diaria para el hipertension, Disp: 30 tablet, Rfl: 3 .  atenolol (TENORMIN) 100 MG tablet, 1 po qd for blood pressure.  Tome una tableta por boca diaria para el hipertension, Disp: 30 tablet, Rfl: 3 .  simvastatin (ZOCOR) 20 MG tablet, 1 po qhs.  Tome una tableta por  boca al dormir (Patient not taking: Reported on 10/02/2019), Disp: 30 tablet, Rfl: 4    Review of Systems  Per HPI unless specifically indicated above     Objective:    BP 100/70   Pulse 63   Temp 97.7 F (36.5 C)   Wt 158 lb 1.6 oz (71.7 kg)   SpO2 98%   BMI 29.87 kg/m   Wt Readings from Last 3 Encounters:  10/02/19 158 lb 1.6 oz (71.7 kg)  12/11/18 156 lb 8 oz (71 kg)  08/28/18 155 lb (70.3 kg)    Physical Exam Vitals signs reviewed.  Constitutional:      Appearance: She is well-developed.  HENT:     Head: Normocephalic and atraumatic.  Neck:     Musculoskeletal: Neck supple.  Cardiovascular:     Rate and Rhythm: Normal rate and regular rhythm.  Pulmonary:     Effort: Pulmonary effort is normal.     Breath sounds: Normal breath sounds.  Abdominal:     General: Bowel sounds are normal.     Palpations: Abdomen is soft. There is no mass.     Tenderness: There is no abdominal tenderness.  Musculoskeletal:     Right lower leg:  No edema.     Left lower leg: No edema.  Lymphadenopathy:     Cervical: No cervical adenopathy.  Skin:    General: Skin is warm and dry.  Neurological:     Mental Status: She is alert and oriented to person, place, and time.  Psychiatric:        Attention and Perception: Attention normal.        Mood and Affect: Mood normal.        Speech: Speech normal.        Behavior: Behavior normal. Behavior is cooperative.     Results for orders placed or performed during the hospital encounter of 08/28/19  Lipid panel  Result Value Ref Range   Cholesterol 218 (H) 0 - 200 mg/dL   Triglycerides 628 <366 mg/dL   HDL 52 >29 mg/dL   Total CHOL/HDL Ratio 4.2 RATIO   VLDL 22 0 - 40 mg/dL   LDL Cholesterol 476 (H) 0 - 99 mg/dL  Comprehensive metabolic panel  Result Value Ref Range   Sodium 138 135 - 145 mmol/L   Potassium 3.4 (L) 3.5 - 5.1 mmol/L   Chloride 103 98 - 111 mmol/L   CO2 26 22 - 32 mmol/L   Glucose, Bld 103 (H) 70 - 99 mg/dL   BUN  11 6 - 20 mg/dL   Creatinine, Ser 5.46 0.44 - 1.00 mg/dL   Calcium 8.8 (L) 8.9 - 10.3 mg/dL   Total Protein 7.3 6.5 - 8.1 g/dL   Albumin 3.8 3.5 - 5.0 g/dL   AST 32 15 - 41 U/L   ALT 31 0 - 44 U/L   Alkaline Phosphatase 105 38 - 126 U/L   Total Bilirubin 1.0 0.3 - 1.2 mg/dL   GFR calc non Af Amer >60 >60 mL/min   GFR calc Af Amer >60 >60 mL/min   Anion gap 9 5 - 15   EKG-sinus bradycardia.  Some T wave changes in the anterior leads.  No previous for comparison.     Assessment & Plan:    Encounter Diagnoses  Name Primary?  . Essential hypertension Yes  . Hyperlipidemia, unspecified hyperlipidemia type   . Not proficient in [place] language   . Chest pain, unspecified type   . Screening for colon cancer   . Gastroesophageal reflux disease, unspecified whether esophagitis present   . Encounter for screening for malignant neoplasm of breast, unspecified screening modality     1.  Chest pain  We will refer to cardiology for evaluation of chest pain in light of patient's hypertension, dyslipidemia, and EKG changes.  Feel more likely the chest pain is being caused by GERD but need to rule out cardiac etiologies.  Patient is given application for [facility] financial assistance.  2 GERD  Patient is not wanting to go back on omeprazole so will place her on pantoprazole.  Patient is counseled to avoid spicy foods and eating within 2 hours of bedtime.  She is given reading information on GERD and encouraged to make lifestyle changes to improve her symptoms.  3.  Hypertension  Blood pressure is very well controlled.  The pharmacy reports that atenolol is not available from the manufacturer at this time so we will continue the amlodipine and change the beta-blocker to metoprolol.  4.  Dyslipidemia  Reviewed labs with patient.  Will change statin to atorvastatin.  Discussed with patient that it is unlikely that her cholesterol medicine was causing her to gain weight or use the  bathroom more frequently.  Counseled patient again on reasons why we treat hyperlipidemia.  Patient encouraged to follow a low-fat diet and get regular exercise to also help with the lipids.    5.  Healthcare maintenance  We will update screening mammogram for screening for breast cancer.  Patient is given iFOBT for colon cancer screening.  She says that she will make sure she returns this test get to the office.    Patient will follow up in 4 to 6 weeks to reevaluate symptoms.  She is to contact the office sooner for worsening or new symptoms.

## 2019-10-02 NOTE — Patient Instructions (Signed)
Enfermedad de reflujo gastroesofgico en los adultos Gastroesophageal Reflux Disease, Adult El reflujo gastroesofgico (RGE) ocurre cuando el cido del estmago sube por el tubo que conecta la boca con el estmago (esfago). Normalmente, la comida baja por el esfago y se mantiene en el [address], donde se la digiere. Sin embargo, cuando una persona tiene AquebogueERGE, los alimentos y el cido estomacal suelen volver al esfago. Si esto se vuelve un problema ms grave, a la persona se le puede diagnosticar una enfermedad llamada enfermedad de reflujo gastroesofgico (ERGE). La ERGE ocurre cuando el reflujo:  Sucede a menudo.  Causa sntomas frecuentes o graves.  Causa problemas tales como dao en el esfago. Cuando el cido del Proofreaderestmago entra en contacto con el esfago, el cido puede provocar dolor (inflamacin) en el esfago. Con el tiempo, pueden formarse pequeos agujeros (lceras) en el revestimiento del esfago. Cules son las causas? Esta afeccin se debe a un problema en el msculo que se encuentra entre el esfago y Investment banker, corporateel estmago (esfnter esofgico inferior, o EEI). Normalmente, el EEI se cierra una vez que la comida pasa a travs del esfago hasta el Yoncallaestmago. Cuando el EEI se encuentra debilitado o tiene alguna anomala, no se cierra por completo, y eso permite que tanto la comida como el jugo gstrico, que es cido, Arizonavuelvan a subir por el esfago. El EEI puede debilitarse a causa de ciertas sustancias alimenticias, medicamentos y Pharmacist, communityafecciones, que incluyen:  El consumo de Lakesidetabaco.  Sunday LakeEmbarazo.  Tener una hernia de hiato.  Consumo de alcohol.  Ciertos alimentos y bebidas, como caf, chocolate, cebollas y Metalinementa. Qu incrementa el riesgo? Es ms probable que tenga esta afeccin si:  Tiene un aumento del Runner, broadcasting/film/videopeso corporal.  Tiene un trastorno del tejido conjuntivo.  Botswanasa antiinflamatorios no esteroideos (AINE). Cules son los signos o los sntomas? Los sntomas de esta afeccin incluyen:   Acidez estomacal.  Dificultad o dolor al tragar.  Sensacin de Warehouse managertener un bulto en la garganta.  Sabor amargo en la boca.  Mal aliento.  Gran cantidad de saliva.  Estmago inflamado o con Dentistmalestar.  Eructos.  Dolor en el pecho. El dolor de pecho puede deberse a distintas afecciones. Es importante que consulte al mdico si tiene dolor de Westonpecho.  Dificultad para respirar o sibilancias.  Tos constante (crnica) o tos nocturna.  Desgaste del Engineer, structuralesmalte dental.  Prdida de peso. Cmo se diagnostica? El mdico le har una historia clnica y un examen fsico. Para determinar si tiene ERGE leve o grave, el mdico tambin puede controlar cmo usted reacciona al tratamiento. Tambin pueden Constellation Energyhacerle estudios, que Williamsonincluyen los siguientes:  Un estudio para examinarle el Yan[place] Hillestmago y el esfago con una cmara pequea (endoscopa).  Una prueba para medir el grado de Technical sales engineeracidez en el esfago.  Una prueba para medir cunta presin hay en el esfago.  Un estudio de deglucin con bario comn o modificado para ver la forma, el tamao y el funcionamiento del esfago. Cmo se trata? El Exeterobjetivo del tratamiento es ayudar a Paramedicaliviar los sntomas y Automotive engineerevitar las complicaciones. El tratamiento de esta afeccin puede variar segn la gravedad de los sntomas. El mdico puede recomendarle lo siguiente:  Cambios en la dieta.  Medicamentos.  Cipriano MileUna ciruga. Siga estas indicaciones en su casa: Comida y bebida   Siga la dieta recomendada por el mdico. Esto puede incluir evitar ciertos alimentos y bebidas, por ejemplo: ? Caf y t (con o sin cafena). ? Bebidas que contengan alcohol. ? Bebidas energticas y deportivas. ? Bebidas gaseosas o  refrescos. ? Chocolate y cacao. ? Menta y [place]. ? Ajo y cebolla. ? Rbano picante. ? Alimentos condimentados, picantes y cidos, por ejemplo, todos los tipos de pimientas, [place] en polvo, curry en polvo, vinagre, salsas picantes y [employer]. ? Ctricos y  sus jugos, por ejemplo, naranjas, limones y limas. ? Alimentos a base de tomate, como salsa de [place], [place], salsa picante y pizza con salsa de [place]k. ? Alimentos fritos y [place], Santa Claus donas, papas fritas y aderezos ricos en grasas. ? Carnes con alto contenido de grasa, como salchichas, y cortes de carnes rojas y blancas con mucha grasa, por ejemplo, chuletas o costillas, embutidos, jamn y tocino. ? Productos lcteos ricos en grasas, como leche [place], [place] y [place] crema.  Haga comidas pequeas y frecuentes Medical sales representative de comidas abundantes.  Evite beber grandes cantidades de lquidos con las comidas.  Evite comer 2 o 3horas antes de acostarse.  Evite recostarse inmediatamente despus de comer.  No haga ejercicios enseguida despus de comer. Estilo de vida   No consuma ningn producto que contenga nicotina o tabaco, como cigarrillos, cigarrillos electrnicos y tabaco de Higher education careers adviser. Si necesita ayuda para dejar de fumar, consulte al MeadWestvaco.  Trate de reducir el estrs con mtodos como el yoga o la meditacin. Si necesita ayuda para reducir Sc[employer] de estrs, consulte al mdico.  Si tiene sobrepeso, baje hasta llegar a un peso saludable para usted. Pdale consejos al mdico para bajar de peso de [place] segura. Indicaciones generales  Est atento a cualquier cambio en los sntomas.  Tome los medicamentos de venta libre y los recetados solamente como se lo haya indicado el mdico. No tome aspirina, ibuprofeno ni otros AINE a menos que el mdico se lo indique.  Use ropa holgada. No use nada apretado alrededor de la cintura que haga presin sobre el abdomen.  Levante (eleve) la cabecera de la cama aproximadamente 6pulgadas (15cm).  Evite inclinarse si al hacerlo empeoran los sntomas.  Concurra a todas las visitas de seguimiento como se lo haya indicado el mdico. Esto es importante. Comunquese con un mdico si:  Tiene los siguientes sntomas: ? Sntomas nuevos. ?  Prdida de peso sin causa aparente. ? Dificultad o dolor al tragar. ? Sibilancias o una tos persistente. ? Voz ronca.  Los sntomas no mejoran con Dispensing optician. Solicite ayuda inmediatamente si:  Danaher Corporation, el cuello, la [place], los dientes o la espalda.  Se siente transpirado, mareado o tiene una sensacin de desvanecimiento.  Siente dolor intenso en el pecho o le falta el aire.  Vomita y el vmito tiene un aspecto similar a la sangre o a los posos de caf.  Se desmaya.  Tiene heces sanguinolentas o negras.  No puede tragar, beber o comer. Resumen  El reflujo gastroesofgico ocurre cuando el cido del estmago sube al esfago. La ERGE es una enfermedad en la que el reflujo ocurre con frecuencia, causa sntomas frecuentes o graves, o causa problemas tales como dao en el esfago.  El tratamiento de esta afeccin puede variar segn la gravedad de los sntomas. El mdico puede indicarle que siga una dieta y haga cambios en su estilo de vida, tome medicamentos o se someta a [place].  Comunquese con un mdico si tiene sntomas nuevos o los sntomas empeoran.  Tome los medicamentos de venta libre y los recetados solamente como se lo haya indicado el mdico. No tome aspirina, ibuprofeno ni otros AINE a menos que el  mdico se lo indique.  Concurra a todas las visitas de [address] se lo haya indicado el mdico. Esto es importante. Esta informacin no tiene Theme park manager el consejo del mdico. Asegrese de hacerle al mdico cualquier pregunta que tenga. Document Released: 08/25/2005 Document Revised: 06/29/2018 Document Reviewed: 06/29/2018 Elsevier Patient Education  2020 [employer]vin[employer]itor.

## 2019-10-04 ENCOUNTER — Telehealth: Payer: Self-pay | Admitting: Student

## 2019-10-04 NOTE — Telephone Encounter (Signed)
LPN called and notified pt that [facility] sent Karina Keller a fax notifying PA that atenolol 100mg  is unavailable from the manufacturer and is suggesting an alternative. PA has sent rx metoprolol 50 mg bid for pt to take in place of atenolol. Pt verbalized understanding.

## 2019-11-06 ENCOUNTER — Ambulatory Visit: Payer: No Typology Code available for payment source | Admitting: Physician Assistant

## 2019-11-13 ENCOUNTER — Other Ambulatory Visit: Payer: Self-pay | Admitting: Physician Assistant

## 2019-11-13 DIAGNOSIS — Z1239 Encounter for other screening for malignant neoplasm of breast: Secondary | ICD-10-CM

## 2019-11-19 ENCOUNTER — Ambulatory Visit (HOSPITAL_COMMUNITY)
Admission: RE | Admit: 2019-11-19 | Discharge: 2019-11-19 | Disposition: A | Discharge status: Home / Self Care | Payer: No Typology Code available for payment source | Source: Ambulatory Visit | Attending: Physician Assistant | Admitting: Physician Assistant

## 2019-11-19 ENCOUNTER — Other Ambulatory Visit: Payer: Self-pay

## 2019-11-19 DIAGNOSIS — Z1231 Encounter for screening mammogram for malignant neoplasm of breast: Secondary | ICD-10-CM | POA: Insufficient documentation

## 2019-11-19 DIAGNOSIS — Z1239 Encounter for other screening for malignant neoplasm of breast: Secondary | ICD-10-CM

## 2019-12-03 NOTE — Progress Notes (Signed)
CARDIOLOGY CONSULT NOTE       Patient ID: Karina Keller MRN: [id number] DOB/AGE: May 02, 1966 54 y.o.  Admit date: (Not on file) Referring Physician: Katherene Ponto Primary Physician: Jacquelin Hawking, PA-C Primary Cardiologist: New Reason for Consultation: Chest Pain  Active Problems:   * No active hospital problems. *   HPI:  54 y.o. referred by Jacquelin Hawking PA-C for chest pain History of GERD,HLD  low thyroid and HTN.  Reviewed primary note from 10/02/19 Patient has not taken statin due to perception of urinary frequency and weight gain She described sudden onset of pain in chest with walking and at work. When busy Pain only lasts minutes No associated symptoms Pain at home not exertional worse at night when she lays down to sleep She does have "acid" and GERD Maalox works better for this than omeprazole PPI restarted BP controlled with metoprolol and norvasc Started on lipitor   Through intepreter she has had atypical pain for over a year. 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 Pain is sharp left shoulder / sternal can last hours No associated Dyspnea Does feel heart beating hard   From [place] Been her 4 years lives with boyfriend   No COVID contacts or symptoms    ROS All other systems reviewed and negative except as noted above  Past Medical History:  Diagnosis Date  . GERD (gastroesophageal reflux disease)   . History of hypothyroidism   . Hypertension   . Thyroid disease     Family History  Problem Relation Age of Onset  . Diabetes Mother   . Heart disease Mother   . Thyroid disease Mother   . Hypertension Father   . COPD Father   . Hypertension Sister   . Hypertension Brother   . Stroke Brother   . Diabetes Brother   . Hypertension Sister     Social History   Socioeconomic History  . Marital status: Unknown    Spouse name: Not on file  . Number of children: Not on file  . Years of education:  Not on file  . Highest education level: Not on file  Occupational History  . Not on file  Tobacco Use  . Smoking status: Never Smoker  . Smokeless tobacco: Never Used  Substance and Sexual Activity  . Alcohol use: No  . Drug use: No  . Sexual activity: Yes    Birth control/protection: Surgical  Other Topics Concern  . Not on file  Social History Narrative  . Not on file   Social Determinants of Health   Financial Resource Strain:   . Difficulty of Paying Living Expenses: Not on file  Food Insecurity:   . Worried About Programme researcher, broadcasting/film/video in the Last Year: Not on file  . Ran Out of Food in the Last Year: Not on file  Transportation Needs:   . Lack of Transportation (Medical): Not on file  . Lack of Transportation (Non-Medical): Not on file  Physical Activity:   . Days of Exercise per Week: Not on file  . Minutes of Exercise per Session: Not on file  Stress:   . Feeling of Stress : Not on file  Social Connections:   . Frequency of Communication with Friends and Family: Not on file  . Frequency of Social Gatherings with Friends and Family: Not on file  . Attends Religious Services: Not on file  . Active Member of Clubs or Organizations: Not on  file  . Attends Archivist Meetings: Not on file  . Marital Status: Not on file  Intimate Partner Violence:   . Fear of Current or Ex-Partner: Not on file  . Emotionally Abused: Not on file  . Physically Abused: Not on file  . Sexually Abused: Not on file    Past Surgical History:  Procedure Laterality Date  . CESAREAN SECTION     x1  . TUBAL LIGATION          Physical Exam: There were no vitals taken for this visit.    Affect appropriate Healthy:  appears stated age 54: normal Neck supple with no adenopathy JVP normal no bruits no thyromegaly Lungs clear with no wheezing and good diaphragmatic motion Heart:  S1/S2 no murmur, no rub, gallop or click PMI normal Abdomen: benighn, BS positve, no  tenderness, no AAA no bruit.  No HSM or HJR Distal pulses intact with no bruits No edema Neuro non-focal Skin warm and dry No muscular weakness   Labs:   Lab Results  Component Value Date   WBC 6.8 03/18/2017   HGB 15.2 03/18/2017   HCT 45.8 (H) 03/18/2017   MCV 92.9 03/18/2017   PLT 287 03/18/2017   No results for input(s): NA, K, CL, CO2, BUN, CREATININE, CALCIUM, PROT, BILITOT, ALKPHOS, ALT, AST, GLUCOSE in the last 168 hours.  Invalid input(s): LABALBU No results found for: CKTOTAL, CKMB, CKMBINDEX, TROPONINI  Lab Results  Component Value Date   CHOL 218 (H) 08/28/2019   CHOL 216 (H) 12/05/2018   CHOL 195 05/22/2018   Lab Results  Component Value Date   HDL 52 08/28/2019   HDL 57 12/05/2018   HDL 53 05/22/2018   Lab Results  Component Value Date   LDLCALC 144 (H) 08/28/2019   LDLCALC 137 (H) 12/05/2018   LDLCALC 129 (H) 05/22/2018   Lab Results  Component Value Date   TRIG 108 08/28/2019   TRIG 109 12/05/2018   TRIG 66 05/22/2018   Lab Results  Component Value Date   CHOLHDL 4.2 08/28/2019   CHOLHDL 3.8 12/05/2018   CHOLHDL 3.7 05/22/2018   No results found for: LDLDIRECT    Radiology: MS DIGITAL SCREENING TOMO BILATERAL  Result Date: 11/20/2019 CLINICAL DATA:  Screening. EXAM: DIGITAL SCREENING BILATERAL MAMMOGRAM WITH TOMO AND CAD COMPARISON:  Previous exam(s). ACR Breast Density Category b: There are scattered areas of fibroglandular density. FINDINGS: There are no findings suspicious for malignancy. Images were processed with CAD. IMPRESSION: No mammographic evidence of malignancy. A result letter of this screening mammogram will be mailed directly to the patient. RECOMMENDATION: Screening mammogram in one year. (Code:SM-B-01Y) BI-RADS CATEGORY  1: Negative. Electronically Signed   By: Marin Olp M.D.   On: 11/20/2019 15:04    EKG: SR inferior lateral T wave inversions no old to compare 10/02/19    ASSESSMENT AND PLAN:   1. Chest Pain: with  risk factors and abnormal ECG cardiac CT would be more challenging as she does not Speak english will order lexiscan myovue  2. GERD:  Discussed low carb diet continue omeprazole and maalox 3. HTN:  Continue norvasc and metroprolol 4. Smoking: counseled on cessation will order CXR consider lung cancer screening CT at age 29 5. HLD:  F/u with primary on lipitor now. Doubt urinary frequency and weight gain related to statin   Lexiscan myovue   F/U PRN if stress test normal   Signed: Jenkins Rouge 12/03/2019, 12:39 PM

## 2019-12-07 ENCOUNTER — Ambulatory Visit (INDEPENDENT_AMBULATORY_CARE_PROVIDER_SITE_OTHER): Payer: Self-pay | Admitting: Cardiovascular Disease

## 2019-12-07 ENCOUNTER — Encounter: Payer: Self-pay | Admitting: Cardiovascular Disease

## 2019-12-07 VITALS — BP 130/80 | HR 75 | Temp 98.0°F | Ht 61.0 in | Wt 155.0 lb

## 2019-12-07 DIAGNOSIS — R079 Chest pain, unspecified: Secondary | ICD-10-CM

## 2019-12-07 NOTE — Patient Instructions (Signed)
Medication Instructions:  Your physician recommends that you continue on your current medications as directed. Please refer to the Current Medication list given to you today.  *If you need a refill on your cardiac medications before your next appointment, please call your pharmacy*  Lab Work: None  If you have labs (blood work) drawn today and your tests are completely normal, you will receive your results only by: Marland Kitchen MyChart Message (if you have MyChart) OR . A paper copy in the mail If you have any lab test that is abnormal or we need to change your treatment, we will call you to review the results.  Testing/Procedures: Your physician has requested that you have a lexiscan myoview. For further information please visit [email or web address] Please follow instruction sheet, as given.    Follow-Up: as needed with Dr.Nishan     Thank you for choosing [facility] !

## 2019-12-14 ENCOUNTER — Encounter (HOSPITAL_COMMUNITY): Payer: Self-pay

## 2019-12-18 ENCOUNTER — Encounter (HOSPITAL_BASED_OUTPATIENT_CLINIC_OR_DEPARTMENT_OTHER)
Admission: RE | Admit: 2019-12-18 | Discharge: 2019-12-18 | Disposition: A | Discharge status: Home / Self Care | Payer: Self-pay | Source: Ambulatory Visit | Attending: Cardiovascular Disease | Admitting: Cardiovascular Disease

## 2019-12-18 ENCOUNTER — Encounter (HOSPITAL_COMMUNITY)
Admission: RE | Admit: 2019-12-18 | Discharge: 2019-12-18 | Disposition: A | Discharge status: Home / Self Care | Payer: Self-pay | Source: Ambulatory Visit | Attending: Cardiovascular Disease | Admitting: Cardiovascular Disease

## 2019-12-18 ENCOUNTER — Other Ambulatory Visit: Payer: Self-pay

## 2019-12-18 DIAGNOSIS — R079 Chest pain, unspecified: Secondary | ICD-10-CM | POA: Insufficient documentation

## 2019-12-18 LAB — NM MYOCAR MULTI W/SPECT W/WALL MOTION / EF
LV dias vol: 55 mL (ref 46–106)
LV sys vol: 14 mL
Peak HR: 94 {beats}/min
RATE: 0.34
Rest HR: 57 {beats}/min
SDS: 0
SRS: 0
SSS: 0
TID: 1.12

## 2019-12-18 MED ORDER — SODIUM CHLORIDE FLUSH 0.9 % IV SOLN
INTRAVENOUS | Status: AC
  Administered 2019-12-18: 09:00:00 10 mL via INTRAVENOUS
  Filled 2019-12-18: qty 10

## 2019-12-18 MED ORDER — REGADENOSON 0.4 MG/5ML IV SOLN
INTRAVENOUS | Status: AC
  Administered 2019-12-18: 09:00:00 0.4 mg via INTRAVENOUS
  Filled 2019-12-18: qty 5

## 2019-12-18 MED ORDER — TECHNETIUM TC 99M TETROFOSMIN IV KIT
30.0000 | PACK | Freq: Once | INTRAVENOUS | Status: AC | PRN
  Administered 2019-12-18: 31.9 via INTRAVENOUS

## 2019-12-18 MED ORDER — TECHNETIUM TC 99M TETROFOSMIN IV KIT
10.0000 | PACK | Freq: Once | INTRAVENOUS | Status: AC | PRN
  Administered 2019-12-18: 08:00:00 11 via INTRAVENOUS

## 2019-12-28 ENCOUNTER — Ambulatory Visit: Payer: Self-pay | Attending: Internal Medicine

## 2019-12-28 ENCOUNTER — Other Ambulatory Visit: Payer: Self-pay

## 2019-12-28 DIAGNOSIS — Z20822 Contact with and (suspected) exposure to covid-19: Secondary | ICD-10-CM | POA: Insufficient documentation

## 2019-12-29 LAB — NOVEL CORONAVIRUS, NAA: SARS-CoV-2, NAA: NOT DETECTED

## 2019-12-31 ENCOUNTER — Telehealth: Payer: Self-pay | Admitting: Student

## 2019-12-31 NOTE — Telephone Encounter (Addendum)
LPN called pt to notify of negative COVID results. LPN spoke with patient's husband which he states she is on her way home. LPN asked pt's husband to have pt call the office back.

## 2019-12-31 NOTE — Telephone Encounter (Signed)
Pt called office back. Pt states she was out getting food as to why she was not at home. LPN explained to pt she should not have been out of her house without knowing her COVID results. LPN notified pt of negative COVID results and pt verbalized understanding. Pt is reporting feeling tired with no other symptoms.  LPN urges pt to isolate herself from her husband to avoid getting symptoms and to avoid getting COVID if he does test positve (husband is symptomatic and awaiting COVID test results which he got done on same day as pt). LPN urges pt to wear mask when her husband is around, but to try to isolate themselves from each other. LPN also urges pt to sanitize and disinfect all surfaces her and her husband may come in contact with. LPN advises pt to contact office is she starts developing symptoms. Pt verbalized understanding.

## 2020-01-01 ENCOUNTER — Other Ambulatory Visit: Payer: Self-pay

## 2020-01-01 ENCOUNTER — Ambulatory Visit: Payer: Self-pay | Attending: Internal Medicine

## 2020-01-01 DIAGNOSIS — U071 COVID-19: Secondary | ICD-10-CM | POA: Insufficient documentation

## 2020-01-01 DIAGNOSIS — Z20822 Contact with and (suspected) exposure to covid-19: Secondary | ICD-10-CM

## 2020-01-02 LAB — NOVEL CORONAVIRUS, NAA: SARS-CoV-2, NAA: DETECTED — AB

## 2020-01-03 ENCOUNTER — Encounter: Payer: Self-pay | Admitting: Physician Assistant

## 2020-01-14 ENCOUNTER — Other Ambulatory Visit: Payer: Self-pay | Admitting: Physician Assistant

## 2020-01-14 MED ORDER — AMLODIPINE BESYLATE 5 MG PO TABS: ORAL_TABLET | ORAL | 1 refills | Status: DC

## 2020-01-14 MED ORDER — METOPROLOL TARTRATE 50 MG PO TABS: ORAL_TABLET | ORAL | 1 refills | Status: DC

## 2020-01-14 MED ORDER — ATORVASTATIN CALCIUM 20 MG PO TABS: ORAL_TABLET | ORAL | 1 refills | Status: DC

## 2020-01-27 ENCOUNTER — Other Ambulatory Visit: Payer: Self-pay

## 2020-01-27 ENCOUNTER — Ambulatory Visit: Payer: Self-pay | Attending: Internal Medicine

## 2020-01-27 DIAGNOSIS — Z23 Encounter for immunization: Secondary | ICD-10-CM | POA: Insufficient documentation

## 2020-01-27 NOTE — Progress Notes (Signed)
   Covid-19 Vaccination Clinic  Name:  Breeanne Oblinger    MRN: [id number] DOB: Nov 12, 1966  01/27/2020  Ms. Lezli Danek was observed post Covid-19 immunization for 15 minutes without incidence. She was provided with Vaccine Information Sheet and instruction to access the V-Safe system.   Ms. Arcenia Scarbro was instructed to call 911 with any severe reactions post vaccine: Marland Kitchen Difficulty breathing  . Swelling of your face and throat  . A fast heartbeat  . A bad rash all over your body  . Dizziness and weakness    Immunizations Administered    Name Date Dose VIS Date Route   Moderna COVID-19 Vaccine 01/27/2020 11:11 AM 0.5 mL 10/30/2019 Intramuscular   Manufacturer: Moderna   Lot: [id number]   NDC: 45146-047-99

## 2020-03-01 ENCOUNTER — Ambulatory Visit: Payer: Self-pay | Attending: Internal Medicine

## 2020-03-01 DIAGNOSIS — Z23 Encounter for immunization: Secondary | ICD-10-CM

## 2020-03-01 NOTE — Progress Notes (Signed)
   Covid-19 Vaccination Clinic  Name:  Karina Keller    MRN: [id number] DOB: 1966/06/04  03/01/2020  Ms. Karina Keller was observed post Covid-19 immunization for 15 minutes without incident. She was provided with Vaccine Information Sheet and instruction to access the V-Safe system.   Ms. Karina Keller was instructed to call 911 with any severe reactions post vaccine: Marland Kitchen Difficulty breathing  . Swelling of face and throat  . A fast heartbeat  . A bad rash all over body  . Dizziness and weakness   Immunizations Administered    Name Date Dose VIS Date Route   Moderna COVID-19 Vaccine 03/01/2020  9:39 AM 0.5 mL 10/30/2019 Intramuscular   Manufacturer: Moderna   Lot: [id number]   NDC: 98069-996-72

## 2020-06-15 ENCOUNTER — Other Ambulatory Visit: Payer: Self-pay | Admitting: Physician Assistant

## 2020-06-15 DIAGNOSIS — E785 Hyperlipidemia, unspecified: Secondary | ICD-10-CM

## 2020-06-15 DIAGNOSIS — I10 Essential (primary) hypertension: Secondary | ICD-10-CM

## 2020-06-20 ENCOUNTER — Other Ambulatory Visit (HOSPITAL_COMMUNITY)
Admission: RE | Admit: 2020-06-20 | Discharge: 2020-06-20 | Disposition: A | Discharge status: Home / Self Care | Payer: Self-pay | Source: Ambulatory Visit | Attending: Physician Assistant | Admitting: Physician Assistant

## 2020-06-20 DIAGNOSIS — E785 Hyperlipidemia, unspecified: Secondary | ICD-10-CM | POA: Insufficient documentation

## 2020-06-20 DIAGNOSIS — I10 Essential (primary) hypertension: Secondary | ICD-10-CM | POA: Insufficient documentation

## 2020-06-20 LAB — COMPREHENSIVE METABOLIC PANEL
ALT: 41 U/L (ref 0–44)
AST: 39 U/L (ref 15–41)
Albumin: 3.9 g/dL (ref 3.5–5.0)
Alkaline Phosphatase: 120 U/L (ref 38–126)
Anion gap: 7 (ref 5–15)
BUN: 12 mg/dL (ref 6–20)
CO2: 27 mmol/L (ref 22–32)
Calcium: 8.7 mg/dL — ABNORMAL LOW (ref 8.9–10.3)
Chloride: 106 mmol/L (ref 98–111)
Creatinine, Ser: 0.56 mg/dL (ref 0.44–1.00)
GFR calc Af Amer: >60 mL/min (ref >60)
GFR calc non Af Amer: >60 mL/min (ref >60)
Glucose, Bld: 104 mg/dL — ABNORMAL HIGH (ref 70–99)
Potassium: 3.3 mmol/L — ABNORMAL LOW (ref 3.5–5.1)
Sodium: 140 mmol/L (ref 135–145)
Total Bilirubin: 0.8 mg/dL (ref 0.3–1.2)
Total Protein: 7.4 g/dL (ref 6.5–8.1)

## 2020-06-20 LAB — LIPID PANEL
Cholesterol: 172 mg/dL (ref 0–200)
HDL: 53 mg/dL (ref >40)
LDL Cholesterol: 103 mg/dL — ABNORMAL HIGH (ref 0–99)
Total CHOL/HDL Ratio: 3.2 RATIO
Triglycerides: 82 mg/dL (ref <150)
VLDL: 16 mg/dL (ref 0–40)

## 2020-06-24 ENCOUNTER — Other Ambulatory Visit: Payer: Self-pay

## 2020-06-24 ENCOUNTER — Ambulatory Visit: Payer: No Typology Code available for payment source | Admitting: Physician Assistant

## 2020-06-24 ENCOUNTER — Encounter: Payer: Self-pay | Admitting: Physician Assistant

## 2020-06-24 VITALS — BP 136/98 | HR 86 | Temp 97.9°F | Ht 61.0 in | Wt 158.0 lb

## 2020-06-24 DIAGNOSIS — E785 Hyperlipidemia, unspecified: Secondary | ICD-10-CM

## 2020-06-24 DIAGNOSIS — K219 Gastro-esophageal reflux disease without esophagitis: Secondary | ICD-10-CM

## 2020-06-24 DIAGNOSIS — I10 Essential (primary) hypertension: Secondary | ICD-10-CM

## 2020-06-24 DIAGNOSIS — Z789 Other specified health status: Secondary | ICD-10-CM

## 2020-06-24 MED ORDER — ATORVASTATIN CALCIUM 20 MG PO TABS: ORAL_TABLET | ORAL | 1 refills | Status: DC

## 2020-06-24 MED ORDER — METOPROLOL TARTRATE 50 MG PO TABS: ORAL_TABLET | ORAL | 1 refills | Status: DC

## 2020-06-24 MED ORDER — AMLODIPINE BESYLATE 5 MG PO TABS: ORAL_TABLET | ORAL | 1 refills | Status: DC

## 2020-06-24 MED ORDER — PANTOPRAZOLE SODIUM 40 MG PO TBEC: 40.0000 mg | DELAYED_RELEASE_TABLET | Freq: Every day | ORAL | 6 refills | Status: DC

## 2020-06-24 NOTE — Progress Notes (Signed)
BP (!) 136/98   Pulse 86   Temp 97.9 F (36.6 C)   Ht 5\' 1"  (1.549 m)   Wt 158 lb (71.7 kg)   SpO2 97%   BMI 29.85 kg/m    Subjective:    Patient ID: , female    DOB: 03-23-1966, 54 y.o.   MRN: [id number]  HPI: Karina Keller is a 54 y.o. female presenting on 06/24/2020 for Hypertension and Hyperlipidemia   HPI    Pt had a negative covid 19 screening questionnaire.     Pt is 54yoF with followup today for htn and dyslipidemia  She is out of her amlodipine.    She has gotten her covid vaccination  She says her Stomach is good with no pain or burning.      Relevant past medical, surgical, family and social history reviewed and updated as indicated. Interim medical history since our last visit reviewed. Allergies and medications reviewed and updated.   Current Outpatient Medications:  .  atorvastatin (LIPITOR) 20 MG tablet, 1 po qhs.  Tome una tableta por boca al dormir, Disp: 90 tablet, Rfl: 1 .  metoprolol tartrate (LOPRESSOR) 50 MG tablet, 1 po twice daily.   Tome una tableta por boca dos veces diarias, Disp: 180 tablet, Rfl: 1 .  pantoprazole (PROTONIX) 40 MG tablet, Take 40 mg by mouth daily., Disp: , Rfl:  .  amLODipine (NORVASC) 5 MG tablet, 1 po qd for blood pressure.  Tome una tableta por boca diaria para el hipertension (Patient not taking: Reported on 06/24/2020), Disp: 90 tablet, Rfl: 1     Review of Systems  Per HPI unless specifically indicated above     Objective:    BP (!) 136/98   Pulse 86   Temp 97.9 F (36.6 C)   Ht 5\' 1"  (1.549 m)   Wt 158 lb (71.7 kg)   SpO2 97%   BMI 29.85 kg/m   Wt Readings from Last 3 Encounters:  06/24/20 158 lb (71.7 kg)  12/07/19 155 lb (70.3 kg)  10/02/19 158 lb 1.6 oz (71.7 kg)    Physical Exam Vitals reviewed.  Constitutional:      General: She is not in acute distress.    Appearance: She is well-developed. She is not toxic-appearing.  HENT:     Head: Normocephalic and  atraumatic.  Cardiovascular:     Rate and Rhythm: Normal rate and regular rhythm.  Pulmonary:     Effort: Pulmonary effort is normal.     Breath sounds: Normal breath sounds.  Abdominal:     General: Bowel sounds are normal.     Palpations: Abdomen is soft. There is no mass.     Tenderness: There is no abdominal tenderness.  Musculoskeletal:     Cervical back: Neck supple.     Right lower leg: No edema.     Left lower leg: No edema.  Lymphadenopathy:     Cervical: No cervical adenopathy.  Skin:    General: Skin is warm and dry.  Neurological:     Mental Status: She is alert and oriented to person, place, and time.  Psychiatric:        Behavior: Behavior normal.     Results for orders placed or performed during the hospital encounter of 06/20/20  Lipid panel  Result Value Ref Range   Cholesterol 172 0 - 200 mg/dL   Triglycerides 82 13/03/20 mg/dL   HDL 53 06/22/20 mg/dL   Total CHOL/HDL Ratio 3.2  RATIO   VLDL 16 0 - 40 mg/dL   LDL Cholesterol 357 (H) 0 - 99 mg/dL  Comprehensive metabolic panel  Result Value Ref Range   Sodium 140 135 - 145 mmol/L   Potassium 3.3 (L) 3.5 - 5.1 mmol/L   Chloride 106 98 - 111 mmol/L   CO2 27 22 - 32 mmol/L   Glucose, Bld 104 (H) 70 - 99 mg/dL   BUN 12 6 - 20 mg/dL   Creatinine, Ser 0.17 0.44 - 1.00 mg/dL   Calcium 8.7 (L) 8.9 - 10.3 mg/dL   Total Protein 7.4 6.5 - 8.1 g/dL   Albumin 3.9 3.5 - 5.0 g/dL   AST 39 15 - 41 U/L   ALT 41 0 - 44 U/L   Alkaline Phosphatase 120 38 - 126 U/L   Total Bilirubin 0.8 0.3 - 1.2 mg/dL   GFR calc non Af Amer >60 >60 mL/min   GFR calc Af Amer >60 >60 mL/min   Anion gap 7 5 - 15      Assessment & Plan:    Encounter Diagnoses  Name Primary?  . Essential hypertension Yes  . Hyperlipidemia, unspecified hyperlipidemia type   . Not proficient in [place] language   . Gastroesophageal reflux disease, unspecified whether esophagitis present      -reviewed labs with pt  -mammogram due december -Pap due  2022 -pt is counseled to avoid running out of her meds.  rx sent to local pharacy and [facility]ist -Pt encouraged to return iFOBT for colon cancer screening -she is to follow up 3 months.  She is to contact office sooner prn

## 2020-09-30 ENCOUNTER — Ambulatory Visit: Payer: No Typology Code available for payment source | Admitting: Physician Assistant

## 2020-10-14 ENCOUNTER — Ambulatory Visit: Payer: No Typology Code available for payment source | Admitting: Physician Assistant

## 2020-10-14 ENCOUNTER — Other Ambulatory Visit: Payer: Self-pay

## 2020-10-14 ENCOUNTER — Other Ambulatory Visit (HOSPITAL_COMMUNITY)
Admission: RE | Admit: 2020-10-14 | Discharge: 2020-10-14 | Disposition: A | Discharge status: Home / Self Care | Payer: Self-pay | Source: Ambulatory Visit | Attending: Physician Assistant | Admitting: Physician Assistant

## 2020-10-14 DIAGNOSIS — E785 Hyperlipidemia, unspecified: Secondary | ICD-10-CM | POA: Insufficient documentation

## 2020-10-14 DIAGNOSIS — I10 Essential (primary) hypertension: Secondary | ICD-10-CM | POA: Insufficient documentation

## 2020-10-14 LAB — COMPREHENSIVE METABOLIC PANEL
ALT: 38 U/L (ref 0–44)
AST: 34 U/L (ref 15–41)
Albumin: 3.9 g/dL (ref 3.5–5.0)
Alkaline Phosphatase: 117 U/L (ref 38–126)
Anion gap: 9 (ref 5–15)
BUN: 11 mg/dL (ref 6–20)
CO2: 25 mmol/L (ref 22–32)
Calcium: 8.8 mg/dL — ABNORMAL LOW (ref 8.9–10.3)
Chloride: 104 mmol/L (ref 98–111)
Creatinine, Ser: 0.54 mg/dL (ref 0.44–1.00)
GFR, Estimated: >60 mL/min (ref >60)
Glucose, Bld: 108 mg/dL — ABNORMAL HIGH (ref 70–99)
Potassium: 3.6 mmol/L (ref 3.5–5.1)
Sodium: 138 mmol/L (ref 135–145)
Total Bilirubin: 0.8 mg/dL (ref 0.3–1.2)
Total Protein: 7.3 g/dL (ref 6.5–8.1)

## 2020-10-14 LAB — LIPID PANEL
Cholesterol: 189 mg/dL (ref 0–200)
HDL: 59 mg/dL (ref >40)
LDL Cholesterol: 115 mg/dL — ABNORMAL HIGH (ref 0–99)
Total CHOL/HDL Ratio: 3.2 RATIO
Triglycerides: 77 mg/dL (ref <150)
VLDL: 15 mg/dL (ref 0–40)

## 2020-10-21 ENCOUNTER — Encounter: Payer: Self-pay | Admitting: Physician Assistant

## 2020-10-21 ENCOUNTER — Ambulatory Visit: Payer: No Typology Code available for payment source | Admitting: Physician Assistant

## 2020-10-21 VITALS — BP 110/80 | HR 67 | Temp 97.2°F | Ht 61.0 in | Wt 161.0 lb

## 2020-10-21 DIAGNOSIS — I10 Essential (primary) hypertension: Secondary | ICD-10-CM

## 2020-10-21 DIAGNOSIS — M722 Plantar fascial fibromatosis: Secondary | ICD-10-CM

## 2020-10-21 DIAGNOSIS — K219 Gastro-esophageal reflux disease without esophagitis: Secondary | ICD-10-CM

## 2020-10-21 DIAGNOSIS — E785 Hyperlipidemia, unspecified: Secondary | ICD-10-CM

## 2020-10-21 DIAGNOSIS — E669 Obesity, unspecified: Secondary | ICD-10-CM

## 2020-10-21 DIAGNOSIS — Z789 Other specified health status: Secondary | ICD-10-CM

## 2020-10-21 DIAGNOSIS — Z1239 Encounter for other screening for malignant neoplasm of breast: Secondary | ICD-10-CM

## 2020-10-21 DIAGNOSIS — Z1211 Encounter for screening for malignant neoplasm of colon: Secondary | ICD-10-CM

## 2020-10-21 DIAGNOSIS — R351 Nocturia: Secondary | ICD-10-CM

## 2020-10-21 NOTE — Progress Notes (Signed)
BP 110/80    Pulse 67    Temp (!) 97.2 F (36.2 C)    Ht 5\' 1"  (1.549 m)    Wt 161 lb (73 kg)    SpO2 98%    BMI 30.42 kg/m    Subjective:    Patient ID: , female    DOB: [birth year], 54 y.o.   MRN: [id number]  HPI: Karina Keller is a 54 y.o. female presenting on 10/21/2020 for Hypertension and Hyperlipidemia   HPI   Pt had a negative covid 19 screening questionnaire.  Chief Complaint  Patient presents with   Hypertension   Hyperlipidemia    Pt complains of Pain in R heel for about a month.  No swelling, just pain.  Mostly at night.  She was given ifobt for colon cancer screening but didn't return it.  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  Her foot will only start to bother her towards the end of her shift.  Pt reports getting up to urinate during the night.  No dysuria or other complaints.    Relevant past medical, surgical, family and social history reviewed and updated as indicated. Interim medical history since our last visit reviewed. Allergies and medications reviewed and updated.    Current Outpatient Medications:    amLODipine (NORVASC) 5 MG tablet, 1 po qd for blood pressure.  Tome una tableta por boca diaria para el hipertension, Disp: 90 tablet, Rfl: 1   atorvastatin (LIPITOR) 20 MG tablet, 1 po qhs.  Tome una tableta por boca al dormir, Disp: 90 tablet, Rfl: 1   metoprolol tartrate (LOPRESSOR) 50 MG tablet, 1 po twice daily.   Tome una tableta por boca dos veces diarias, Disp: 180 tablet, Rfl: 1   pantoprazole (PROTONIX) 40 MG tablet, Take 1 tablet (40 mg total) by mouth daily. Tome una tableta por boca diaria, Disp: 30 tablet, Rfl: 6   Review of Systems  Per HPI unless specifically indicated above     Objective:    BP 110/80    Pulse 67    Temp (!) 97.2 F (36.2 C)    Ht 5\' 1"  (1.549 m)    Wt 161 lb (73 kg)    SpO2 98%    BMI 30.42 kg/m   Wt Readings from Last 3 Encounters:  10/21/20 161 lb (73 kg)  06/24/20 158 lb  (71.7 kg)  12/07/19 155 lb (70.3 kg)    Physical Exam Vitals reviewed.  Constitutional:      General: She is not in acute distress.    Appearance: She is well-developed. She is not ill-appearing.  HENT:     Head: Normocephalic and atraumatic.  Cardiovascular:     Rate and Rhythm: Normal rate and regular rhythm.  Pulmonary:     Effort: Pulmonary effort is normal.     Breath sounds: Normal breath sounds.  Abdominal:     General: Bowel sounds are normal.     Palpations: Abdomen is soft. There is no mass.     Tenderness: There is no abdominal tenderness.  Musculoskeletal:     Cervical back: Neck supple.     Right lower leg: No edema.     Left lower leg: No edema.     Right foot: Normal range of motion. Tenderness present. No swelling or deformity. Normal pulse.     Left foot: Normal.     Comments: Tenderness plantar surface R foot over the heel  Lymphadenopathy:     Cervical: No  cervical adenopathy.  Skin:    General: Skin is warm and dry.  Neurological:     Mental Status: She is alert and oriented to person, place, and time.  Psychiatric:        Behavior: Behavior normal.     Results for orders placed or performed during the hospital encounter of 10/14/20  Lipid panel  Result Value Ref Range   Cholesterol 189 0 - 200 mg/dL   Triglycerides 77 <182 mg/dL   HDL 59 >88 mg/dL   Total CHOL/HDL Ratio 3.2 RATIO   VLDL 15 0 - 40 mg/dL   LDL Cholesterol 337 (H) 0 - 99 mg/dL  Comprehensive metabolic panel  Result Value Ref Range   Sodium 138 135 - 145 mmol/L   Potassium 3.6 3.5 - 5.1 mmol/L   Chloride 104 98 - 111 mmol/L   CO2 25 22 - 32 mmol/L   Glucose, Bld 108 (H) 70 - 99 mg/dL   BUN 11 6 - 20 mg/dL   Creatinine, Ser 4.45 0.44 - 1.00 mg/dL   Calcium 8.8 (L) 8.9 - 10.3 mg/dL   Total Protein 7.3 6.5 - 8.1 g/dL   Albumin 3.9 3.5 - 5.0 g/dL   AST 34 15 - 41 U/L   ALT 38 0 - 44 U/L   Alkaline Phosphatase 117 38 - 126 U/L   Total Bilirubin 0.8 0.3 - 1.2 mg/dL   GFR,  Estimated >14 >60 mL/min   Anion gap 9 5 - 15      Assessment & Plan:   Encounter Diagnoses  Name Primary?   Essential hypertension Yes   Hyperlipidemia, unspecified hyperlipidemia type    Gastroesophageal reflux disease, unspecified whether esophagitis present    Plantar fasciitis    Obesity, unspecified classification, unspecified obesity type, unspecified whether serious comorbidity present    Screening for colon cancer    Encounter for screening for malignant neoplasm of breast, unspecified screening modality    Not proficient in English language    Nocturia       -reviewed labs with pt  -will refer for screening mammogram which is due in december -pt encouraged to Return ifobt for colon cancer screening -counseled pt on kegel exercises and recomended no drinking 2 hr before bed to help with nocturia -recommended Shoe inserts, avoiding walking barefoot and ice 3-4 times/day for 5-10 minutes and gentle flexion/extension exercises for feet -pt to continue currrent rx -she is to follow up 4 months.  She is to contact office sooner prn

## 2020-10-21 NOTE — Patient Instructions (Addendum)
[facility] [address] [place], [place] [address] [phone]  [facility] [address] [phone]   -----------------------------------------------------------    Fascitis plantar Plantar Fasciitis  La fascitis plantar es una afeccin dolorosa que se produce en el taln. Ocurre cuando la banda de tejido que [employer] dedos con el hueso del taln (fascia plantar) se irrita. Esto puede ocurrir por Academic librarian ejercicio u otras actividades repetitivas (lesin por uso excesivo). El dolor de la fascitis plantar puede abarcar desde una leve irritacin a dolor intenso, y en los casos ms agudos puede dificultar que la persona camine o se [place]. Por lo general, el dolor es peor a la maana despus de dormir, o despus de Personal assistant sentado o acostado durante un tiempo. El dolor tambin puede empeorar despus de caminar o estar de pie por [employer]. Cules son las causas? Esta afeccin puede ser causada por lo siguiente:  Estar de pie durante largos perodos.  Usar zapatos que no tengan un buen soporte para el arco.  Practicar actividades que ponen tensin en las articulaciones (actividades de alto impacto), como correr, Radio producer ejercicios aerbicos o Market researcher.  Tener sobrepeso.  Tener una forma de caminar (un andar) anormal.  Presentar rigidez muscular en la parte posterior de la parte inferior de la pierna (pantorrilla).  Tener un arco alto de los pies.  Comenzar una nueva actividad fsica. Cules son los signos o los sntomas? El sntoma principal de esta afeccin es el dolor en el taln. El dolor:  Puede empeorar con los primeros pasos luego de estar en reposo, especialmente por la maana despus de dormir o de haber estado sentado o acostado durante [place].  Puede empeorar despus de largos perodos de estar parado.  Puede disminuir despus de 30 a 45 minutos de [place], como caminar apaciblemente. Cmo se diagnostica? Esta afeccin puede diagnosticarse  en funcin de sus antecedentes mdicos y sus sntomas. Su mdico puede preguntarle sobre su nivel de [place]. El Production manager un examen fsico para controlar si tiene lo siguiente:  Un rea dolorida en la parte inferior del pie.  El arco alto.  Dolor al Doctor, general practice.  Dificultad para mover el pie. Pueden realizarle estudios de diagnstico por imagen para confirmar el diagnstico, por ejemplo:  Radiografas.  Ecografa.  Resonancia magntica (RM). Cmo se trata? El tratamiento de la fascitis plantar depende de la gravedad de su afeccin. El tratamiento puede incluir lo siguiente:  Reposo, hielo, presin (compresin) y Lexicographer el pie afectado (elevacin). Esto puede conocerse como [place] de RHCE (Reposo, hielo, compresin, elevacin). El mdico puede recomendarle Terapia de RHCE junto con medicamentos de venta libre para Engineer, materials.  Ejercicios para estirar las pantorrillas y la fascia plantar.  Una frula que [employer] estirado y [place] mientras usted duerme (frula nocturna).  Fisioterapia para [employer] sntomas y Physiological scientist en el futuro.  Inyecciones de corticoesteroides para Engineer, materials y la inflamacin.  Estimular su fascia plantar lesionada con impulsos elctricos (tratamiento con ondas de choque extracorprea). Esto generalmente es la ltima opcin de tratamiento antes de la [place].  [place], si los otros tratamientos no han funcionado despus de . Siga estas indicaciones en su casa:  Control del dolor, la rigidez y la hinchazn  Si se lo indican, aplique hielo sobre la zona del dolor: ? Ponga el hielo en una bolsa de plstico o use una botella de agua congelada. ? Coloque una [employer] piel y la bolsa de hielo o  la botella. ? Frote la parte inferior del pie sobre la bolsa o la botella. ? Haga esto durante , de 2a 3veces al [employer].  Use calzado deportivo con amortiguacin de aire o gel, o intente usar  plantillas blandas diseadas para la fascitis plantar.  Cuando est sentado o acostado, levante (eleve) el pie por encima del nivel del corazn. Actividad  Evite las actividades que causan dolor. Pregntele al mdico qu actividades son seguras para usted.  Haga los ejercicios de fisioterapia y estiramiento como se lo haya indicado el mdico.  Intente hacer actividades y tipos de ejercicio que sean ms fciles para las articulaciones (de bajo impacto). Por ejemplo, nadar, hacer ejercicios [employer], y Lobbyist. Instrucciones generales  [employer] de venta libre y los recetados solamente como se lo haya indicado el mdico.  Si el mdico se lo indica, use una frula nocturna para dormir. Afloje la frula si los dedos de los pies se le entumecen, siente hormigueos o se le enfran y se tornan de Research officer, trade union.  Mantenga un peso saludable, o colabore con su mdico para perder [employer].  Concurra a todas las visitas de control como se lo haya indicado el mdico. Esto es importante. Comunquese con un mdico si:  Tiene sntomas que no desaparecen despus tratarlos en su casa.  Siente un dolor que [place].  Siente un dolor que afecta su capacidad de moverse o de Education officer, environmental sus actividades diarias. Resumen  La fascitis plantar es una afeccin dolorosa que se produce en el taln. Ocurre cuando la banda de tejido que [employer] dedos con el hueso del taln (fascia plantar) se irrita.  El sntoma principal de esta afeccin es Chief Technology Officer en el taln que puede empeorar despus de hacer mucho ejercicio o de Personal assistant parado por mucho tiempo.  El tratamiento vara, pero por lo general comienza con reposo, hielo, compresin, y elevacin (Tratamiento de RHCE) y los medicamentos de venta libre para Human resources officer. Esta informacin no tiene Theme park manager el consejo del mdico. Asegrese de hacerle al mdico cualquier pregunta que tenga. Document Revised: 02/25/2018  Document Reviewed: 11/06/2017 Elsevier Patient Education  2020 [employer].   ------------------------------------------------   Ejercicios de Kegel Kegel Exercises  Los ejercicios de Kegel pueden ayudar a fortalecer los msculos del suelo plvico. El suelo plvico est formado por un grupo de msculos que sostienen el recto, el intestino delgado y la vejiga. En las mujeres, los msculos del suelo plvico tambin sostienen el tero. Estos msculos ayudan a controlar el flujo de [place] y materia fecal (heces). Los ejercicios de Kegel son indoloros y simples, y no requieren ningn equipo. El mdico puede sugerir ejercicios de Kegel para:  Mejorar el control de la vejiga y los intestinos.  Mejorar la respuesta sexual.  Mejorar los msculos dbiles del suelo plvico despus de una ciruga para extirpar el tero (histerectoma) o de un embarazo (mujeres).  Mejorar los msculos del suelo plvico dbiles despus de la extirpacin o ciruga de la prstata (hombres). Los ejercicios de Kegel consisten en contraer los msculos del suelo plvico, que son los mismos que contrae al intentar detener el flujo de la orina o evitar eliminar gases. Estos ejercicios se pueden Ecologist est sentado, parado o [place], pero lo mejor es variar la posicin. Ejercicios Cmo hacer los ejercicios de Kegel: 1. Contraiga con fuerza los msculos del suelo plvico. Debe sentir que el rea rectal se eleva y se tensa. Si es [place], [place]  sentir tensin en el rea vaginal. Mantenga el estmago, las nalgas y las piernas [place]. 2. Mantenga los msculos tensos durante 10segundos como mximo. 3. Respire con normalidad. 4. Relaje los msculos. 5. Repita como se lo haya indicado el mdico. Repita este ejercicio a diario como se lo haya indicado el mdico. Contine haciendo este ejercicio durante al menos 4 a 6semanas o el tiempo que le haya indicado su mdico. Pueden derivarlo a un fisioterapeuta que lo  puede ayudar a aprender ms sobre cmo hacer los ejercicios de Kegel. Segn sea su estado, el mdico puede recomendarle lo siguiente:  Variar cunto [employer].  Hacer varias series de ejercicios [employer].  Hacer ejercicios durante varias semanas.  Convertir los ejercicios de Kegel en parte de su rutina de ejercicios habitual. Esta informacin no tiene Theme park manager el consejo del mdico. Asegrese de hacerle al mdico cualquier pregunta que tenga. Document Revised: 08/16/2018 Document Reviewed: 08/16/2018 Elsevier Patient Education  2020 [employer].

## 2020-10-27 ENCOUNTER — Other Ambulatory Visit: Payer: Self-pay | Admitting: Physician Assistant

## 2020-10-27 MED ORDER — AMLODIPINE BESYLATE 5 MG PO TABS: ORAL_TABLET | ORAL | 1 refills | Status: DC

## 2020-10-30 ENCOUNTER — Ambulatory Visit: Payer: Self-pay | Attending: Internal Medicine

## 2020-10-30 DIAGNOSIS — Z23 Encounter for immunization: Secondary | ICD-10-CM

## 2020-10-30 NOTE — Progress Notes (Signed)
   Covid-19 Vaccination Clinic  Name:  Karina Keller    MRN: [id number] DOB: [birth year]  10/30/2020   Through the telephonic interpreting services   Ms. [name] was observed post Covid-19 immunization for 15 minutes without incident. She was provided with Vaccine Information Sheet and instruction to access the V-Safe system.   Ms. [name] was instructed to call 911 with any severe reactions post vaccine: Marland Kitchen Difficulty breathing  . Swelling of face and throat  . A fast heartbeat  . A bad rash all over body  . Dizziness and weakness   Immunizations Administered    No immunizations on file.

## 2020-11-03 ENCOUNTER — Other Ambulatory Visit: Payer: Self-pay | Admitting: Student

## 2020-11-03 DIAGNOSIS — Z1239 Encounter for other screening for malignant neoplasm of breast: Secondary | ICD-10-CM

## 2020-11-06 ENCOUNTER — Ambulatory Visit: Payer: Self-pay

## 2020-11-24 ENCOUNTER — Ambulatory Visit (HOSPITAL_COMMUNITY): Payer: Self-pay

## 2020-12-15 ENCOUNTER — Encounter (HOSPITAL_COMMUNITY): Payer: Self-pay

## 2020-12-15 ENCOUNTER — Ambulatory Visit (HOSPITAL_COMMUNITY): Payer: Self-pay

## 2020-12-19 ENCOUNTER — Ambulatory Visit (HOSPITAL_COMMUNITY): Payer: Self-pay

## 2020-12-19 ENCOUNTER — Encounter (HOSPITAL_COMMUNITY): Payer: Self-pay

## 2021-02-04 ENCOUNTER — Other Ambulatory Visit: Payer: Self-pay | Admitting: Physician Assistant

## 2021-02-04 DIAGNOSIS — E785 Hyperlipidemia, unspecified: Secondary | ICD-10-CM

## 2021-02-04 DIAGNOSIS — I10 Essential (primary) hypertension: Secondary | ICD-10-CM

## 2021-02-10 ENCOUNTER — Other Ambulatory Visit: Payer: Self-pay

## 2021-02-10 ENCOUNTER — Other Ambulatory Visit (HOSPITAL_COMMUNITY)
Admission: RE | Admit: 2021-02-10 | Discharge: 2021-02-10 | Disposition: A | Discharge status: Home / Self Care | Payer: Self-pay | Source: Ambulatory Visit | Attending: Physician Assistant | Admitting: Physician Assistant

## 2021-02-10 DIAGNOSIS — E785 Hyperlipidemia, unspecified: Secondary | ICD-10-CM | POA: Insufficient documentation

## 2021-02-10 DIAGNOSIS — I10 Essential (primary) hypertension: Secondary | ICD-10-CM | POA: Insufficient documentation

## 2021-02-10 LAB — COMPREHENSIVE METABOLIC PANEL
ALT: 38 U/L (ref 0–44)
AST: 39 U/L (ref 15–41)
Albumin: 3.8 g/dL (ref 3.5–5.0)
Alkaline Phosphatase: 105 U/L (ref 38–126)
Anion gap: 10 (ref 5–15)
BUN: 10 mg/dL (ref 6–20)
CO2: 25 mmol/L (ref 22–32)
Calcium: 8.7 mg/dL — ABNORMAL LOW (ref 8.9–10.3)
Chloride: 105 mmol/L (ref 98–111)
Creatinine, Ser: 0.5 mg/dL (ref 0.44–1.00)
GFR, Estimated: >60 mL/min (ref >60)
Glucose, Bld: 100 mg/dL — ABNORMAL HIGH (ref 70–99)
Potassium: 3.4 mmol/L — ABNORMAL LOW (ref 3.5–5.1)
Sodium: 140 mmol/L (ref 135–145)
Total Bilirubin: 0.7 mg/dL (ref 0.3–1.2)
Total Protein: 7.3 g/dL (ref 6.5–8.1)

## 2021-02-10 LAB — LIPID PANEL
Cholesterol: 194 mg/dL (ref 0–200)
HDL: 58 mg/dL (ref >40)
LDL Cholesterol: 117 mg/dL — ABNORMAL HIGH (ref 0–99)
Total CHOL/HDL Ratio: 3.3 RATIO
Triglycerides: 93 mg/dL (ref <150)
VLDL: 19 mg/dL (ref 0–40)

## 2021-02-17 ENCOUNTER — Ambulatory Visit: Payer: No Typology Code available for payment source | Admitting: Physician Assistant

## 2021-02-17 ENCOUNTER — Encounter: Payer: Self-pay | Admitting: Physician Assistant

## 2021-02-17 ENCOUNTER — Other Ambulatory Visit: Payer: Self-pay

## 2021-02-17 VITALS — BP 152/93 | HR 65 | Temp 96.7°F | Wt 156.0 lb

## 2021-02-17 DIAGNOSIS — E785 Hyperlipidemia, unspecified: Secondary | ICD-10-CM

## 2021-02-17 DIAGNOSIS — R079 Chest pain, unspecified: Secondary | ICD-10-CM

## 2021-02-17 DIAGNOSIS — Z789 Other specified health status: Secondary | ICD-10-CM

## 2021-02-17 DIAGNOSIS — K219 Gastro-esophageal reflux disease without esophagitis: Secondary | ICD-10-CM

## 2021-02-17 DIAGNOSIS — I10 Essential (primary) hypertension: Secondary | ICD-10-CM

## 2021-02-17 DIAGNOSIS — Z1239 Encounter for other screening for malignant neoplasm of breast: Secondary | ICD-10-CM

## 2021-02-17 MED ORDER — METOPROLOL TARTRATE 50 MG PO TABS: ORAL_TABLET | ORAL | 0 refills | Status: DC

## 2021-02-17 MED ORDER — ATORVASTATIN CALCIUM 20 MG PO TABS: ORAL_TABLET | ORAL | 0 refills | Status: DC

## 2021-02-17 MED ORDER — AMLODIPINE BESYLATE 5 MG PO TABS: ORAL_TABLET | ORAL | 0 refills | Status: DC

## 2021-02-17 NOTE — Progress Notes (Signed)
BP (!) 152/93   Pulse 65   Temp (!) 96.7 F (35.9 C)   Wt 156 lb (70.8 kg)   SpO2 99%   BMI 29.48 kg/m    Subjective:    Patient ID: Karina Keller, female    DOB: Oct 09, 1966, 55 y.o.   MRN: [id number]  HPI: Karina Keller is a 55 y.o. female presenting on 02/17/2021 for Hyperlipidemia and Hypertension   HPI   Pt had a negative covid 19 screening questionnaire.     Chief Complaint  Patient presents with  . Hyperlipidemia  . Hypertension     She is working at [employer].  She is sleepy and tired.  Otherwise she is good.    She has been out of her cholesterol medication for about a month.  She has some at home; she just stopped taking them.   She didn't make it to her mammogram appt  She didn't return the ifobt Given to her in  November for colon cancer screening.  She is having CP.     She doesn't know how long this has been going on nor how long it lasts. She is Not having the pain right now but she did have it since she was in the office today.  She says she had it for "a second".  She says that is how long it usually lasts.  She has No associated sob.  She doesn't feel like she is having anxiety.  Pain usually comes with - she doesn't kknow if it comes at rest or with exertion, she doesn't pay attention.    Pt was complaining of CP in november 2020.  She was sent to cardiology.   She had a myovue in 11/2019 which was normal     Relevant past medical, surgical, family and social history reviewed and updated as indicated. Interim medical history since our last visit reviewed. Allergies and medications reviewed and updated.   Current Outpatient Medications:  .  amLODipine (NORVASC) 5 MG tablet, 1 po qd for blood pressure.  Tome una tableta por boca diaria para el hipertension, Disp: 30 tablet, Rfl: 1 .  metoprolol tartrate (LOPRESSOR) 50 MG tablet, 1 po twice daily.   Tome una tableta por [employer] veces diarias, Disp: 180 tablet, Rfl: 1 .  atorvastatin  (LIPITOR) 20 MG tablet, 1 po qhs.  Tome una tableta por boca al dormir (Patient not taking: Reported on 02/17/2021), Disp: 90 tablet, Rfl: 1 .  pantoprazole (PROTONIX) 40 MG tablet, Take 1 tablet (40 mg total) by mouth daily. Tome una tableta por boca diaria (Patient not taking: Reported on 02/17/2021), Disp: 30 tablet, Rfl: 6    Review of Systems  Per HPI unless specifically indicated above     Objective:    BP (!) 152/93   Pulse 65   Temp (!) 96.7 F (35.9 C)   Wt 156 lb (70.8 kg)   SpO2 99%   BMI 29.48 kg/m   Wt Readings from Last 3 Encounters:  02/17/21 156 lb (70.8 kg)  10/21/20 161 lb (73 kg)  06/24/20 158 lb (71.7 kg)    Physical Exam Vitals reviewed.  Constitutional:      General: She is not in acute distress.    Appearance: She is well-developed. She is not ill-appearing.  HENT:     Head: Normocephalic and atraumatic.  Cardiovascular:     Rate and Rhythm: Normal rate and regular rhythm.     Pulses: Normal pulses.  Heart sounds: Normal heart sounds.  Pulmonary:     Effort: Pulmonary effort is normal.     Breath sounds: Normal breath sounds.  Abdominal:     General: Bowel sounds are normal.     Palpations: Abdomen is soft. There is no mass.     Tenderness: There is no abdominal tenderness.  Musculoskeletal:     Cervical back: Neck supple.     Right lower leg: No edema.     Left lower leg: No edema.  Lymphadenopathy:     Cervical: No cervical adenopathy.  Skin:    General: Skin is warm and dry.  Neurological:     Mental Status: She is alert and oriented to person, place, and time.  Psychiatric:        Behavior: Behavior normal.     Results for orders placed or performed during the hospital encounter of 02/10/21  Lipid panel  Result Value Ref Range   Cholesterol 194 0 - 200 mg/dL   Triglycerides 93 <607 mg/dL   HDL 58 >37 mg/dL   Total CHOL/HDL Ratio 3.3 RATIO   VLDL 19 0 - 40 mg/dL   LDL Cholesterol 106 (H) 0 - 99 mg/dL  Comprehensive  metabolic panel  Result Value Ref Range   Sodium 140 135 - 145 mmol/L   Potassium 3.4 (L) 3.5 - 5.1 mmol/L   Chloride 105 98 - 111 mmol/L   CO2 25 22 - 32 mmol/L   Glucose, Bld 100 (H) 70 - 99 mg/dL   BUN 10 6 - 20 mg/dL   Creatinine, Ser 2.69 0.44 - 1.00 mg/dL   Calcium 8.7 (L) 8.9 - 10.3 mg/dL   Total Protein 7.3 6.5 - 8.1 g/dL   Albumin 3.8 3.5 - 5.0 g/dL   AST 39 15 - 41 U/L   ALT 38 0 - 44 U/L   Alkaline Phosphatase 105 38 - 126 U/L   Total Bilirubin 0.7 0.3 - 1.2 mg/dL   GFR, Estimated >48 >54 mL/min   Anion gap 10 5 - 15    EKG- .  Sinus rhythm.  no changes compared with EKG done 10/02/2019       Assessment & Plan:    Encounter Diagnoses  Name Primary?  . Essential hypertension Yes  . Hyperlipidemia, unspecified hyperlipidemia type   . Gastroesophageal reflux disease, unspecified whether esophagitis present   . Chest pain, unspecified type   . Encounter for screening for malignant neoplasm of breast, unspecified screening modality   . Not proficient in [place] language       -Reviewed labs with pt -Pt reminded to return ifbot test for colon cancer screening -will refer again for screening mammogram -pt is counseled to Restart protonix -pt is counseled to Restart atorvastatin.  She is encouraged to take the Metoprolol and amlodipine as prescribed -pt to follow up 1 month to recheck bp and CP.  She is to contact office sooner prn

## 2021-03-04 ENCOUNTER — Ambulatory Visit (HOSPITAL_COMMUNITY)
Admission: RE | Admit: 2021-03-04 | Discharge: 2021-03-04 | Disposition: A | Discharge status: Home / Self Care | Payer: Self-pay | Source: Ambulatory Visit | Attending: Physician Assistant | Admitting: Physician Assistant

## 2021-03-04 DIAGNOSIS — Z1239 Encounter for other screening for malignant neoplasm of breast: Secondary | ICD-10-CM | POA: Insufficient documentation

## 2021-03-24 ENCOUNTER — Ambulatory Visit: Payer: No Typology Code available for payment source | Admitting: Physician Assistant

## 2021-03-24 ENCOUNTER — Other Ambulatory Visit: Payer: Self-pay

## 2021-03-24 ENCOUNTER — Encounter: Payer: Self-pay | Admitting: Physician Assistant

## 2021-03-24 VITALS — BP 130/85 | HR 75 | Temp 97.4°F | Wt 155.0 lb

## 2021-03-24 DIAGNOSIS — E785 Hyperlipidemia, unspecified: Secondary | ICD-10-CM

## 2021-03-24 DIAGNOSIS — K219 Gastro-esophageal reflux disease without esophagitis: Secondary | ICD-10-CM

## 2021-03-24 DIAGNOSIS — Z789 Other specified health status: Secondary | ICD-10-CM

## 2021-03-24 DIAGNOSIS — I10 Essential (primary) hypertension: Secondary | ICD-10-CM

## 2021-03-24 NOTE — Progress Notes (Signed)
BP 130/85   Pulse 75   Temp (!) 97.4 F (36.3 C)   Wt 155 lb (70.3 kg)   SpO2 98%   BMI 29.29 kg/m    Subjective:    Patient ID: Karina Keller, female    DOB: [birth year], 55 y.o.   MRN: [id number]  HPI: Karina Keller is a 55 y.o. female presenting on 03/24/2021 for No chief complaint on file.   HPI    Pt had a negative covid 19 screening questionnaire.    Pt is 54yoF who presents for follow up htn, dyslipidemia and gerd.  She is only taking her atorvastatin some times because she thinks it makes her pee during the night.  Her stomach is better on the protonix.  She has still not returned ifob given in November for colon cancer screening.      Relevant past medical, surgical, family and social history reviewed and updated as indicated. Interim medical history since our last visit reviewed. Allergies and medications reviewed and updated.   Current Outpatient Medications:  .  amLODipine (NORVASC) 5 MG tablet, 1 po qd for blood pressure.  Tome una tableta por boca diaria para el hipertension, Disp: 90 tablet, Rfl: 0 .  atorvastatin (LIPITOR) 20 MG tablet, 1 po qhs.  Tome una tableta por boca al dormir, Disp: 90 tablet, Rfl: 0 .  metoprolol tartrate (LOPRESSOR) 50 MG tablet, 1 po twice daily.   Tome una tableta por boca dos veces diarias, Disp: 180 tablet, Rfl: 0 .  pantoprazole (PROTONIX) 40 MG tablet, Take 1 tablet (40 mg total) by mouth daily. Tome una tableta por boca diaria, Disp: 30 tablet, Rfl: 6     Review of Systems  Per HPI unless specifically indicated above     Objective:    BP 130/85   Pulse 75   Temp (!) 97.4 F (36.3 C)   Wt 155 lb (70.3 kg)   SpO2 98%   BMI 29.29 kg/m   Wt Readings from Last 3 Encounters:  03/24/21 155 lb (70.3 kg)  02/17/21 156 lb (70.8 kg)  10/21/20 161 lb (73 kg)    Physical Exam Vitals reviewed.  Constitutional:      General: She is not in acute distress.    Appearance: She is well-developed. She is  not toxic-appearing.  HENT:     Head: Normocephalic and atraumatic.  Cardiovascular:     Rate and Rhythm: Normal rate and regular rhythm.  Pulmonary:     Effort: Pulmonary effort is normal.     Breath sounds: Normal breath sounds.  Abdominal:     General: Bowel sounds are normal.     Palpations: Abdomen is soft. There is no mass.     Tenderness: There is no abdominal tenderness.  Musculoskeletal:     Cervical back: Neck supple.     Right lower leg: No edema.     Left lower leg: No edema.  Lymphadenopathy:     Cervical: No cervical adenopathy.  Skin:    General: Skin is warm and dry.  Neurological:     Mental Status: She is alert and oriented to person, place, and time.  Psychiatric:        Behavior: Behavior normal.     Results for orders placed or performed during the hospital encounter of 02/10/21  Lipid panel  Result Value Ref Range   Cholesterol 194 0 - 200 mg/dL   Triglycerides 93 <628 mg/dL   HDL 58 >36 mg/dL  Total CHOL/HDL Ratio 3.3 RATIO   VLDL 19 0 - 40 mg/dL   LDL Cholesterol 983 (H) 0 - 99 mg/dL  Comprehensive metabolic panel  Result Value Ref Range   Sodium 140 135 - 145 mmol/L   Potassium 3.4 (L) 3.5 - 5.1 mmol/L   Chloride 105 98 - 111 mmol/L   CO2 25 22 - 32 mmol/L   Glucose, Bld 100 (H) 70 - 99 mg/dL   BUN 10 6 - 20 mg/dL   Creatinine, Ser 3.82 0.44 - 1.00 mg/dL   Calcium 8.7 (L) 8.9 - 10.3 mg/dL   Total Protein 7.3 6.5 - 8.1 g/dL   Albumin 3.8 3.5 - 5.0 g/dL   AST 39 15 - 41 U/L   ALT 38 0 - 44 U/L   Alkaline Phosphatase 105 38 - 126 U/L   Total Bilirubin 0.7 0.3 - 1.2 mg/dL   GFR, Estimated >50 >53 mL/min   Anion gap 10 5 - 15      Assessment & Plan:   Encounter Diagnoses  Name Primary?  . Essential hypertension Yes  . Hyperlipidemia, unspecified hyperlipidemia type   . Gastroesophageal reflux disease, unspecified whether esophagitis present   . Not proficient in [place] language      -reviewed labs with pt  -pt encouraged to  Take atorvastatin daily -pt to Contine current meds -F/u 3 months with update PAP at that time.  Pt reminded to return iFOBT.  She is to contact office sooner prn

## 2021-05-15 ENCOUNTER — Other Ambulatory Visit: Payer: Self-pay | Admitting: Physician Assistant

## 2021-05-19 ENCOUNTER — Other Ambulatory Visit: Payer: Self-pay | Admitting: Physician Assistant

## 2021-05-19 ENCOUNTER — Encounter: Payer: Self-pay | Admitting: Physician Assistant

## 2021-05-19 MED ORDER — AMLODIPINE BESYLATE 5 MG PO TABS: ORAL_TABLET | ORAL | 3 refills | Status: DC

## 2021-05-19 MED ORDER — PANTOPRAZOLE SODIUM 40 MG PO TBEC: 40.0000 mg | DELAYED_RELEASE_TABLET | Freq: Every day | ORAL | 6 refills | Status: DC

## 2021-05-19 MED ORDER — METOPROLOL TARTRATE 50 MG PO TABS: ORAL_TABLET | ORAL | 3 refills | Status: DC

## 2021-05-19 MED ORDER — ATORVASTATIN CALCIUM 20 MG PO TABS: ORAL_TABLET | ORAL | Status: DC

## 2021-06-10 ENCOUNTER — Other Ambulatory Visit: Payer: Self-pay | Admitting: Physician Assistant

## 2021-06-10 DIAGNOSIS — I10 Essential (primary) hypertension: Secondary | ICD-10-CM

## 2021-06-10 DIAGNOSIS — E785 Hyperlipidemia, unspecified: Secondary | ICD-10-CM

## 2021-06-23 ENCOUNTER — Ambulatory Visit: Payer: No Typology Code available for payment source | Admitting: Physician Assistant

## 2021-06-29 ENCOUNTER — Other Ambulatory Visit (HOSPITAL_COMMUNITY)
Admission: RE | Admit: 2021-06-29 | Discharge: 2021-06-29 | Disposition: A | Discharge status: Home / Self Care | Payer: Self-pay | Source: Ambulatory Visit | Attending: Physician Assistant | Admitting: Physician Assistant

## 2021-06-29 DIAGNOSIS — I10 Essential (primary) hypertension: Secondary | ICD-10-CM

## 2021-06-29 DIAGNOSIS — E785 Hyperlipidemia, unspecified: Secondary | ICD-10-CM

## 2021-06-29 LAB — LIPID PANEL
Cholesterol: 210 mg/dL — ABNORMAL HIGH (ref 0–200)
HDL: 51 mg/dL (ref >40)
LDL Cholesterol: 141 mg/dL — ABNORMAL HIGH (ref 0–99)
Total CHOL/HDL Ratio: 4.1 RATIO
Triglycerides: 90 mg/dL (ref <150)
VLDL: 18 mg/dL (ref 0–40)

## 2021-06-29 LAB — COMPREHENSIVE METABOLIC PANEL
ALT: 37 U/L (ref 0–44)
AST: 35 U/L (ref 15–41)
Albumin: 3.9 g/dL (ref 3.5–5.0)
Alkaline Phosphatase: 100 U/L (ref 38–126)
Anion gap: 5 (ref 5–15)
BUN: 11 mg/dL (ref 6–20)
CO2: 28 mmol/L (ref 22–32)
Calcium: 8.5 mg/dL — ABNORMAL LOW (ref 8.9–10.3)
Chloride: 106 mmol/L (ref 98–111)
Creatinine, Ser: 0.56 mg/dL (ref 0.44–1.00)
GFR, Estimated: >60 mL/min (ref >60)
Glucose, Bld: 102 mg/dL — ABNORMAL HIGH (ref 70–99)
Potassium: 3.7 mmol/L (ref 3.5–5.1)
Sodium: 139 mmol/L (ref 135–145)
Total Bilirubin: 0.7 mg/dL (ref 0.3–1.2)
Total Protein: 7.2 g/dL (ref 6.5–8.1)

## 2021-06-30 ENCOUNTER — Ambulatory Visit: Payer: No Typology Code available for payment source | Admitting: Physician Assistant

## 2021-06-30 ENCOUNTER — Encounter: Payer: Self-pay | Admitting: Physician Assistant

## 2021-06-30 ENCOUNTER — Other Ambulatory Visit: Payer: Self-pay

## 2021-06-30 VITALS — BP 131/85 | HR 67 | Temp 97.0°F | Wt 152.0 lb

## 2021-06-30 DIAGNOSIS — I10 Essential (primary) hypertension: Secondary | ICD-10-CM

## 2021-06-30 DIAGNOSIS — Z1211 Encounter for screening for malignant neoplasm of colon: Secondary | ICD-10-CM

## 2021-06-30 DIAGNOSIS — E785 Hyperlipidemia, unspecified: Secondary | ICD-10-CM

## 2021-06-30 DIAGNOSIS — K219 Gastro-esophageal reflux disease without esophagitis: Secondary | ICD-10-CM

## 2021-06-30 DIAGNOSIS — Z972 Presence of dental prosthetic device (complete) (partial): Secondary | ICD-10-CM

## 2021-06-30 DIAGNOSIS — Z789 Other specified health status: Secondary | ICD-10-CM

## 2021-06-30 DIAGNOSIS — Z5320 Procedure and treatment not carried out because of patient's decision for unspecified reasons: Secondary | ICD-10-CM

## 2021-06-30 MED ORDER — AMLODIPINE BESYLATE 5 MG PO TABS: ORAL_TABLET | ORAL | 1 refills | Status: DC

## 2021-06-30 MED ORDER — ATORVASTATIN CALCIUM 20 MG PO TABS: ORAL_TABLET | ORAL | 1 refills | Status: DC

## 2021-06-30 MED ORDER — PANTOPRAZOLE SODIUM 40 MG PO TBEC: 40.0000 mg | DELAYED_RELEASE_TABLET | Freq: Every day | ORAL | 6 refills | Status: DC

## 2021-06-30 MED ORDER — METOPROLOL TARTRATE 50 MG PO TABS: ORAL_TABLET | ORAL | 1 refills | Status: DC

## 2021-06-30 NOTE — Patient Instructions (Addendum)
Cuidados de la dentadura postiza Denture Care Una dentadura postiza es un dispositivo dental removible que reemplaza las piezas dentales perdidas. Una dentadura postiza completa reemplaza todos los dientes en la mandbula superior o inferior. Una dentadura postiza parcial solo reemplaza algunos de los dientes superiores o inferiores. La dentadura postiza est hecha para verse como las piezas dentales naturales. Una dentadura postiza puede: Darle una sonrisa ms natural. Rellenar la cara en zonas donde faltan piezas dentales. Mejorar su capacidad de comer y hablar. Las bacterias y la placa pueden acumularse en una dentadura postiza, al igual que en las piezas dentales regulares. Practicar una buena higiene bucal y cuidar adecuadamente la dentadura postiza son importantes para prevenir lairritacin o una infeccin en la boca y las encas. Cmo cuidar la dentadura postiza Use la dentadura postiza como se lo haya indicado el odontlogo. Deber quitarse la dentadura postiza cada da para enjuagarla, limpiarla y remojarla. Es posible que Set designer la dentadura postiza durante la noche. Siga estas instrucciones para el cuidado de la dentadura postiza: Qutese la dentadura postiza todos [address] y enjuague todo resto de partculas de alimentos. Al enjuagar la dentadura postiza sobre el lavabo, coloque una toalla en el lavabo o llnelo con agua. Esto ayudar a prevenir daos en la dentadura postiza si se le cae. Limpie la dentadura [employer] con un cepillo de dientes de cerda [place] y un limpiador para dentaduras postizas no abrasivo. Pdale a su dentista que le recomiende una solucin de limpieza para dentadura postiza. Las soluciones de limpieza vienen en diversas formas, entre ellas comprimidos, cremas, pastas, geles y soluciones. Si [place] un [employer] dental, asegrese de eliminar todo el [employer] de la boca y de la dentadura postiza cada da y reemplazarlo por Percival Spanish. Si no va a  colocarse la dentadura postiza en la boca inmediatamente despus de limpiarla, colquela en un recipiente y asegrese de que est [place] con agua. Guarde el recipiente en un lugar seguro. Antes de colocarse la dentadura postiza en la boca: Cepllese las encas, la lengua y el paladar con dentfrico y un cepillo de cerdas suaves. Enjuague la dentadura postiza y colquesela nuevamente en la boca mientras est hmeda. Consejos generales Haga limpiar su dentadura postiza por su odontlogo al menos una vez por ao. Asegrese de visitar a su odontlogo para los controles de rutina, especialmente si tiene una dentadura postiza removible que se apoya en piezas dentales naturales. Para limpiar la dentadura postiza, primero enjuague las partculas sueltas de alimentos, luego quite el [place] de la dentadura postiza y por ltimo use una solucin de limpieza comercial para dentaduras postizas. No: Cepille la dentadura postiza con dentfrico. El dentfrico es demasiado abrasivo para las dentaduras postizas y puede daarlas. Se cepille la boca con una solucin de limpieza para dentadura postiza ni utilice una solucin de limpieza para dentaduras postizas para limpiar la dentadura postiza dentro de la boca. Las soluciones de limpieza solo deben utilizarse fuera de la boca. Remoje la dentadura postiza en agua hirviendo. Remoje la dentadura postiza en una solucin blanqueadora para dentaduras postizas durante ms de 10 minutos. Trate de reparar con pegamento una dentadura postiza rota. Comunquese con un mdico si: La dentadura postiza se daa. La dentadura postiza no encaja bien. Tiene irritacin, enrojecimiento, hinchazn u otra anomala en las encas o la boca. Resumen [place] dentadura postiza es un dispositivo removible que reemplaza las piezas dentales perdidas. No debe usar la dentadura postiza de da y de noche. Qutese la dentadura postiza cada  da para enjuagarla, limpiarla y remojarla. Use un cepillo  de dientes de cerda Bahamassuave y un limpiador para dentaduras postizas no abrasivo para limpiar la dentadura postiza. Antes de volver a colocarse la dentadura postiza en la boca, cepllese las encas, la lengua y el paladar con dentfrico y un cepillo de cerdas suaves. Visite al odontlogo con regularidad para los controles de [place]. Esta informacin no tiene Theme park managercomo fin reemplazar el consejo del mdico. Asegresede hacerle al mdico cualquier pregunta que tenga. Document Revised: 04/10/2018 Document Reviewed: 04/10/2018 Elsevier Patient Education  2022 Elsevier Inc.  --------------------------------------------------------------------------------------------  Pilgrim's PrideColesterol elevado High Cholesterol  El colesterol elevado es una afeccin que se caracteriza porque la sangre tiene niveles altos de una sustancia Fairplayblanca, cerosa y parecida a la grasa (colesterol). El hgado fabrica todo el colesterol que el organismo necesita. El organismo humano necesita pequeas cantidades de colesterol para formar las clulas. Elexceso de colesterol se obtiene de los alimentos que se consumen. La sangre transporta el colesterol desde el hgado al resto del cuerpo. Si tiene el colesterol elevado, pueden acumularse depsitos (placas) en las paredes de las arterias. Las arterias son los vasos sanguneos que transportan la sangre desde el corazn al resto del cuerpo. Las placas causanque las arterias se Armed forces training and education officerestrechen y Futures traderse endurezcan. Las placas de colesterol aumentan el riesgo de sufrir un infarto de miocardio y un accidente cerebrovascular. Trabaje con el mdico para English as a second language teachermantener lasconcentraciones de colesterol en un rango saludable. Qu incrementa el riesgo? Los siguientes factores pueden hacer que sea ms propenso a Aeronautical engineerdesarrollar esta afeccin: Consumir alimentos con alto contenido de grasa animal (grasa saturada) o colesterol. Tener sobrepeso. No hacer suficiente ejercicio fsico. Tener antecedentes familiares de colesterol alto  (hipercolesterolemia familiar). Consumir productos con tabaco. Tener diabetes. Cules son los signos o sntomas? Esta afeccin no presenta sntomas. Cmo se diagnostica? Esta afeccin se puede diagnosticar en funcin de los [address]ados de un anlisis de [place] Brooksangre. Si es mayor de 20 aos, es posible que el mdico le controle el nivel de colesterol cada 4 a 6 aos. Los controles pueden ser ms frecuentes si tiene el colesterol elevado u otros factores de riesgo de enfermedad cardaca. En el anlisis de sangre de Green Treecolesterol, se determina lo siguiente: El colesterol "malo", o colesterol LDL. Este es el principal tipo de colesterol que causa enfermedades cardacas. El nivel recomendado es de menos de 100 mg/dl. El colesterol "bueno", o colesterol HDL. El HDL ayuda a proteger contra la enfermedad cardaca porque limpia las arterias y arrastra el LDL al hgado para que lo procese. El nivel recomendado de HDL es de 60 mg/dl o ms. Triglicridos. Estos son grasas que el organismo puede Academic librarianalmacenar o quemar como fuente de [place] Sarasotaenerga. El nivel recomendado es de menos de 150 mg/dl. Colesterol total. Mide la cantidad total de colesterol en la sangre, e incluye el colesterol LDL, el colesterol HDL y los triglicridos. El nivel recomendado es de menos de 200 mg/dl. Cmo se trata? El tratamiento de esta afeccin puede incluir: Cambios en la dieta. Es posible que le indiquen que consuma alimentos con ms Guyanafibra y menos grasas saturadas o Engineer, miningazcar agregada. Cambios en el estilo de vida. Estos pueden incluir hacer actividad fsica con regularidad, mantener un peso saludable y dejar de consumir productos con tabaco. Medicamentos. Estos se administran cuando los Allied Waste Industriescambios en la dieta y en el estilo de vida no han sido eficaces. Es posible que le receten medicamentos llamados estatinas para bajar sus niveles de colesterol. Siga estas instrucciones en su casa: Comida y  bebida  Siga una dieta saludable y equilibrada. Esta dieta  incluye lo siguiente: Porciones diarias de fruta s y verduras frescas, congeladas o enlatadas. Porciones diarias de alimentos integrales con alto contenido de Kevin. Alimentos con bajo contenido de grasas saturadas y grasas trans. Estos incluyen carne de ave y pescado sin piel, cortes de carne magros y productos lcteos descremados. Una variedad de pescado, especialmente pescado graso que contenga cidos grasos omega 3. Propngase comer pescado al [employer] veces por semana. Evite los alimentos y las bebidas que tengan Engineer, mining. Use mtodos de coccin saludables, como asar, Software engineer, hervir, hornear, escalfar, cocer al vapor y Actor. No fra los alimentos excepto para saltearlos.  Estilo de vida  Hacer ejercicio con regularidad. Trate de hacer un total de 150 minutos de actividad fsica por semana. Aumente la cantidad de ejercicio fsico que realiza mediante actividades como la jardinera, Gaffer a Advertising account planner o usar las escaleras. No consuma ningn producto que contenga nicotina o tabaco, como cigarrillos, cigarrillos electrnicos y tabaco de Theatre manager. Si necesita ayuda para dejar de fumar, consulte al mdico.  Instrucciones generales Use los medicamentos de venta libre y los recetados solamente como se lo haya indicado el mdico. Oceanographer a todas las visitas de seguimiento como se lo haya indicado el mdico. Esto es importante. Dnde buscar ms informacin American Heart Association (Asociacin Estadounidense del Corazn): www.heart.org National Heart, Lung, and Blood Institute ([employer] del [place], los Pulmones y Risk manager): [email or web address] Comunquese con un mdico si: Tiene dificultad para alcanzar o mantener una alimentacin sana y un peso saludable. Est por comenzar un programa de ejercicios. No puede dejar de fumar. Solicite ayuda de inmediato si: Midwife. Tiene dificultad para respirar. Tiene sntomas de un accidente cerebrovascular. "BE FAST" es una  manera fcil de recordar los principales signos de advertencia de un accidente cerebrovascular: B - Balance (equilibrio). Los signos son mareos, dificultad repentina para caminar o prdida del equilibrio. E - Eyes (ojos). Los signos son problemas para ver o un cambio repentino en la visin. F - Face (rostro). Los signos son debilidad repentina o adormecimiento del rostro, o el rostro o el prpado que se caen hacia un lado. A - Arms (brazos). Los signos son debilidad o adormecimiento en un brazo. Esto sucede de repente y generalmente en un lado del cuerpo. S - Speech (habla). Los signos son dificultad para hablar, hablar arrastrando las palabras o dificultad para comprender lo que la gente dice. T - Time (tiempo). Es tiempo de llamar al servicio de Sports administrator. Anote la hora a la que [employer] sntomas. Presenta otros signos de un accidente cerebrovascular, como los siguientes: Dolor de cabeza sbito e intenso que no tiene causa aparente. Nuseas o vmitos. Convulsiones. Estos sntomas pueden representar un problema grave que constituye Radio broadcast assistant. No espere a ver si los sntomas desaparecen. Solicite atencin mdica de inmediato. Comunquese con el servicio de emergencias de su localidad (911 en los [place]). No conduzca por sus propios medios [employer]. Resumen Las placas de colesterol aumentan el riesgo de sufrir un infarto de miocardio y un accidente cerebrovascular. Trabaje con el mdico para [employer] concentraciones de colesterol en un rango saludable. Siga una dieta saludable y equilibrada, haga ejercicio con regularidad y Shan Levans un peso saludable. No consuma ningn producto que contenga nicotina o tabaco, como cigarrillos, cigarrillos electrnicos y tabaco de Theatre manager. Obtenga ayuda de inmediato si tiene cualquier sntoma de un accidente cerebrovascular. Esta informacin no tiene Theme park manager  el consejo del mdico. Asegresede hacerle al mdico cualquier  pregunta que tenga. Document Revised: 12/21/2019 Document Reviewed: 12/21/2019 Elsevier Patient Education  2022 [employer].

## 2021-06-30 NOTE — Progress Notes (Signed)
BP 131/85   Pulse 67   Temp (!) 97 F (36.1 C)   Wt 152 lb (68.9 kg)   SpO2 96%   BMI 28.72 kg/m    Subjective:    Patient ID: Karina Keller, female    DOB: [birth year], 55 y.o.   MRN: [id number]  HPI: Karina Keller is a 55 y.o. female presenting on 06/30/2021 for Hypertension   HPI   Pt had a negative covid 19 screening questionnaire.  Chief Complaint  Patient presents with   Hypertension   Hyperlipidemia   Gynecologic Exam     She is still working at [employer].  She is doing well.   Pt never returned FIT test given to her for colon cancer screening.    She doesn't take her cholesterol medication every day.  She is still taking the protonix but feels like- she feels like she has something in her stomach that gives her bad breath.  She doesn't feel like a pressure in her stomach.  She is self conscious about her breath.  She has no stomach symptoms at all.  She is just guessing that must be the cause of her bad breath.  She has not been told that she has bad breath, she just feels like she does.  She has no teeth. She has dentures that come out.  She takes them out and cleans them every day.  She feels like she has had the problems with her breath for about 2 years, since she got her dentures.     Pt is scheduled for PAP today but doesn't want to get it done today.   She only says that she isn't ready.   Pt asks if she could get a second covid booster.  She is concerned because her mother in [place] died from covid.    Relevant past medical, surgical, family and social history reviewed and updated as indicated. Interim medical history since our last visit reviewed. Allergies and medications reviewed and updated.   Current Outpatient Medications:    amLODipine (NORVASC) 5 MG tablet, 1 po qd.  Tome una tableta por boca diaria, Disp: 30 tablet, Rfl: 3   atorvastatin (LIPITOR) 20 MG tablet, TAKE 1 Tablet BY MOUTH ONCE EVERY NIGHT AT BEDTIME.  Tome una tableta  por boca al dormir, Disp: 30 tablet, Rfl: 03   metoprolol tartrate (LOPRESSOR) 50 MG tablet, TAKE 1 Tablet  BY MOUTH TWICE DAILY.  Tome una tableta por [employer] veces diarias, Disp: 60 tablet, Rfl: 3   pantoprazole (PROTONIX) 40 MG tablet, Take 1 tablet (40 mg total) by mouth daily. Tome una tableta por boca diaria, Disp: 30 tablet, Rfl: 6    Review of Systems  Per HPI unless specifically indicated above     Objective:    BP 131/85   Pulse 67   Temp (!) 97 F (36.1 C)   Wt 152 lb (68.9 kg)   SpO2 96%   BMI 28.72 kg/m   Wt Readings from Last 3 Encounters:  06/30/21 152 lb (68.9 kg)  03/24/21 155 lb (70.3 kg)  02/17/21 156 lb (70.8 kg)    Physical Exam Vitals reviewed.  Constitutional:      General: She is not in acute distress.    Appearance: She is well-developed. She is not ill-appearing.  HENT:     Head: Normocephalic and atraumatic.     Mouth/Throat:     Lips: Pink.     Mouth: Mucous membranes are moist.  Dentition: Has dentures.     Tongue: No lesions.     Comments: No odor Cardiovascular:     Rate and Rhythm: Normal rate and regular rhythm.  Pulmonary:     Effort: Pulmonary effort is normal.     Breath sounds: Normal breath sounds.  Abdominal:     General: Bowel sounds are normal.     Palpations: Abdomen is soft. There is no fluid wave, hepatomegaly, splenomegaly, mass or pulsatile mass.     Tenderness: There is no abdominal tenderness.  Musculoskeletal:     Cervical back: Neck supple.     Right lower leg: No edema.     Left lower leg: No edema.  Lymphadenopathy:     Cervical: No cervical adenopathy.  Skin:    General: Skin is warm and dry.  Neurological:     Mental Status: She is alert and oriented to person, place, and time.  Psychiatric:        Behavior: Behavior normal.    Results for orders placed or performed during the hospital encounter of 06/29/21  Comprehensive metabolic panel  Result Value Ref Range   Sodium 139 135 - 145 mmol/L    Potassium 3.7 3.5 - 5.1 mmol/L   Chloride 106 98 - 111 mmol/L   CO2 28 22 - 32 mmol/L   Glucose, Bld 102 (H) 70 - 99 mg/dL   BUN 11 6 - 20 mg/dL   Creatinine, Ser 1.19 0.44 - 1.00 mg/dL   Calcium 8.5 (L) 8.9 - 10.3 mg/dL   Total Protein 7.2 6.5 - 8.1 g/dL   Albumin 3.9 3.5 - 5.0 g/dL   AST 35 15 - 41 U/L   ALT 37 0 - 44 U/L   Alkaline Phosphatase 100 38 - 126 U/L   Total Bilirubin 0.7 0.3 - 1.2 mg/dL   GFR, Estimated >14 >78 mL/min   Anion gap 5 5 - 15  Lipid panel  Result Value Ref Range   Cholesterol 210 (H) 0 - 200 mg/dL   Triglycerides 90 <295 mg/dL   HDL 51 >62 mg/dL   Total CHOL/HDL Ratio 4.1 RATIO   VLDL 18 0 - 40 mg/dL   LDL Cholesterol 130 (H) 0 - 99 mg/dL      Assessment & Plan:   Encounter Diagnoses  Name Primary?   Hyperlipidemia, unspecified hyperlipidemia type Yes   Essential hypertension    Gastroesophageal reflux disease, unspecified whether esophagitis present    Wears dentures    Pap smear of cervix declined    Screening for colon cancer    Not proficient in [place] language       -Reviewed labs with pt  -Pt is encouraged to take her statin every day and follow lowfat diet.  She is given reading information -pt is given another FIT test for colon cancer screening -pt to continue current bp medication -pt is encouraged to clean dentures daily.  Can use gum or hard candies to help increase saliva production.  She was given reading information -pt is eligible for 2nd covid booster.  This was discussed -pt to follow up 3 months.  She is to contact office sooner prn

## 2021-07-02 ENCOUNTER — Encounter: Payer: Self-pay | Admitting: Physician Assistant

## 2021-07-15 ENCOUNTER — Other Ambulatory Visit: Payer: Self-pay | Admitting: Physician Assistant

## 2021-07-15 DIAGNOSIS — Z1211 Encounter for screening for malignant neoplasm of colon: Secondary | ICD-10-CM

## 2021-07-15 LAB — IFOBT (OCCULT BLOOD): IFOBT: NEGATIVE

## 2021-09-15 ENCOUNTER — Other Ambulatory Visit: Payer: Self-pay | Admitting: Physician Assistant

## 2021-09-15 DIAGNOSIS — I10 Essential (primary) hypertension: Secondary | ICD-10-CM

## 2021-09-15 DIAGNOSIS — E785 Hyperlipidemia, unspecified: Secondary | ICD-10-CM

## 2021-09-17 ENCOUNTER — Other Ambulatory Visit: Payer: Self-pay | Admitting: Physician Assistant

## 2021-09-17 MED ORDER — AMLODIPINE BESYLATE 5 MG PO TABS: ORAL_TABLET | ORAL | 1 refills | Status: DC

## 2021-09-17 MED ORDER — METOPROLOL TARTRATE 50 MG PO TABS: ORAL_TABLET | ORAL | 1 refills | Status: DC

## 2021-09-17 MED ORDER — PANTOPRAZOLE SODIUM 40 MG PO TBEC: 40.0000 mg | DELAYED_RELEASE_TABLET | Freq: Every day | ORAL | 6 refills | Status: DC

## 2021-09-17 MED ORDER — ATORVASTATIN CALCIUM 20 MG PO TABS: ORAL_TABLET | ORAL | 1 refills | Status: DC

## 2021-09-29 ENCOUNTER — Ambulatory Visit: Payer: No Typology Code available for payment source | Admitting: Physician Assistant

## 2021-10-12 ENCOUNTER — Other Ambulatory Visit (HOSPITAL_COMMUNITY)
Admission: RE | Admit: 2021-10-12 | Discharge: 2021-10-12 | Disposition: A | Discharge status: Home / Self Care | Payer: Self-pay | Source: Ambulatory Visit | Attending: Physician Assistant | Admitting: Physician Assistant

## 2021-10-12 DIAGNOSIS — I10 Essential (primary) hypertension: Secondary | ICD-10-CM | POA: Insufficient documentation

## 2021-10-12 DIAGNOSIS — E785 Hyperlipidemia, unspecified: Secondary | ICD-10-CM | POA: Insufficient documentation

## 2021-10-12 LAB — LIPID PANEL
Cholesterol: 214 mg/dL — ABNORMAL HIGH (ref 0–200)
HDL: 58 mg/dL (ref >40)
LDL Cholesterol: 131 mg/dL — ABNORMAL HIGH (ref 0–99)
Total CHOL/HDL Ratio: 3.7 RATIO
Triglycerides: 126 mg/dL (ref <150)
VLDL: 25 mg/dL (ref 0–40)

## 2021-10-12 LAB — COMPREHENSIVE METABOLIC PANEL
ALT: 35 U/L (ref 0–44)
AST: 34 U/L (ref 15–41)
Albumin: 4 g/dL (ref 3.5–5.0)
Alkaline Phosphatase: 117 U/L (ref 38–126)
Anion gap: 6 (ref 5–15)
BUN: 8 mg/dL (ref 6–20)
CO2: 30 mmol/L (ref 22–32)
Calcium: 8.9 mg/dL (ref 8.9–10.3)
Chloride: 104 mmol/L (ref 98–111)
Creatinine, Ser: 0.57 mg/dL (ref 0.44–1.00)
GFR, Estimated: >60 mL/min (ref >60)
Glucose, Bld: 106 mg/dL — ABNORMAL HIGH (ref 70–99)
Potassium: 3.4 mmol/L — ABNORMAL LOW (ref 3.5–5.1)
Sodium: 140 mmol/L (ref 135–145)
Total Bilirubin: 0.7 mg/dL (ref 0.3–1.2)
Total Protein: 7.6 g/dL (ref 6.5–8.1)

## 2021-10-13 ENCOUNTER — Ambulatory Visit: Payer: No Typology Code available for payment source | Admitting: Physician Assistant

## 2021-11-10 ENCOUNTER — Encounter: Payer: Self-pay | Admitting: Physician Assistant

## 2021-11-10 ENCOUNTER — Ambulatory Visit: Payer: No Typology Code available for payment source | Admitting: Physician Assistant

## 2021-11-10 VITALS — BP 117/86 | HR 77 | Temp 97.1°F | Wt 151.0 lb

## 2021-11-10 DIAGNOSIS — E785 Hyperlipidemia, unspecified: Secondary | ICD-10-CM

## 2021-11-10 DIAGNOSIS — I10 Essential (primary) hypertension: Secondary | ICD-10-CM

## 2021-11-10 DIAGNOSIS — K219 Gastro-esophageal reflux disease without esophagitis: Secondary | ICD-10-CM

## 2021-11-10 DIAGNOSIS — Z789 Other specified health status: Secondary | ICD-10-CM

## 2021-11-10 MED ORDER — METOPROLOL TARTRATE 50 MG PO TABS: ORAL_TABLET | ORAL | 3 refills | Status: DC

## 2021-11-10 MED ORDER — PANTOPRAZOLE SODIUM 40 MG PO TBEC
40.0000 mg | DELAYED_RELEASE_TABLET | Freq: Every day | ORAL | 6 refills | Status: DC
Start: 2021-11-10 — End: 2023-03-23

## 2021-11-10 MED ORDER — AMLODIPINE BESYLATE 5 MG PO TABS: ORAL_TABLET | ORAL | 3 refills | Status: DC

## 2021-11-10 MED ORDER — ATORVASTATIN CALCIUM 20 MG PO TABS: ORAL_TABLET | ORAL | 3 refills | Status: DC

## 2021-11-10 NOTE — Progress Notes (Signed)
BP 117/86   Pulse 77   Temp (!) 97.1 F (36.2 C)   Wt 151 lb (68.5 kg)   SpO2 95%   BMI 28.53 kg/m    Subjective:    Patient ID: Karina Keller, female    DOB: September 11, 1966, 55 y.o.   MRN: [id number]  HPI: Karina Keller is a 55 y.o. female presenting on 11/10/2021 for Hypertension and Hyperlipidemia   HPI   Chief Complaint  Patient presents with   Hypertension   Hyperlipidemia    Pt says she is doing well.  She is Not taking her cholesterol medication.  She says her Stomach feels good.  She has no complaints today.    Relevant past medical, surgical, family and social history reviewed and updated as indicated. Interim medical history since our last visit reviewed. Allergies and medications reviewed and updated.   Current Outpatient Medications:    amLODipine (NORVASC) 5 MG tablet, 1 po qd.  Tome una tableta por boca diaria, Disp: 30 tablet, Rfl: 1   metoprolol tartrate (LOPRESSOR) 50 MG tablet, TAKE 1 Tablet  BY MOUTH TWICE DAILY.  Tome una tableta por [employer] veces diarias, Disp: 60 tablet, Rfl: 1   pantoprazole (PROTONIX) 40 MG tablet, Take 1 tablet (40 mg total) by mouth daily. Tome una tableta por boca diaria, Disp: 30 tablet, Rfl: 6   atorvastatin (LIPITOR) 20 MG tablet, TAKE 1 Tablet BY MOUTH ONCE EVERY NIGHT AT BEDTIME.  Tome una tableta por boca al dormir (Patient not taking: Reported on 11/10/2021), Disp: 30 tablet, Rfl: 1   Review of Systems  Per HPI unless specifically indicated above     Objective:    BP 117/86   Pulse 77   Temp (!) 97.1 F (36.2 C)   Wt 151 lb (68.5 kg)   SpO2 95%   BMI 28.53 kg/m   Wt Readings from Last 3 Encounters:  11/10/21 151 lb (68.5 kg)  06/30/21 152 lb (68.9 kg)  03/24/21 155 lb (70.3 kg)    Physical Exam Vitals reviewed.  Constitutional:      General: She is not in acute distress.    Appearance: She is well-developed. She is not ill-appearing.  HENT:     Head: Normocephalic and atraumatic.   Cardiovascular:     Rate and Rhythm: Normal rate and regular rhythm.  Pulmonary:     Effort: Pulmonary effort is normal.     Breath sounds: Normal breath sounds.  Abdominal:     General: Bowel sounds are normal.     Palpations: Abdomen is soft. There is no mass.     Tenderness: There is no abdominal tenderness.  Musculoskeletal:     Cervical back: Neck supple.     Right lower leg: No edema.     Left lower leg: No edema.  Lymphadenopathy:     Cervical: No cervical adenopathy.  Skin:    General: Skin is warm and dry.  Neurological:     Mental Status: She is alert and oriented to person, place, and time.  Psychiatric:        Attention and Perception: Attention normal.        Speech: Speech normal.        Behavior: Behavior normal. Behavior is cooperative.    Results for orders placed or performed during the hospital encounter of 10/12/21  Lipid panel  Result Value Ref Range   Cholesterol 214 (H) 0 - 200 mg/dL   Triglycerides 626 <948 mg/dL  HDL 58 >40 mg/dL   Total CHOL/HDL Ratio 3.7 RATIO   VLDL 25 0 - 40 mg/dL   LDL Cholesterol 626 (H) 0 - 99 mg/dL  Comprehensive metabolic panel  Result Value Ref Range   Sodium 140 135 - 145 mmol/L   Potassium 3.4 (L) 3.5 - 5.1 mmol/L   Chloride 104 98 - 111 mmol/L   CO2 30 22 - 32 mmol/L   Glucose, Bld 106 (H) 70 - 99 mg/dL   BUN 8 6 - 20 mg/dL   Creatinine, Ser 9.48 0.44 - 1.00 mg/dL   Calcium 8.9 8.9 - 54.6 mg/dL   Total Protein 7.6 6.5 - 8.1 g/dL   Albumin 4.0 3.5 - 5.0 g/dL   AST 34 15 - 41 U/L   ALT 35 0 - 44 U/L   Alkaline Phosphatase 117 38 - 126 U/L   Total Bilirubin 0.7 0.3 - 1.2 mg/dL   GFR, Estimated >27 >03 mL/min   Anion gap 6 5 - 15      Assessment & Plan:    Encounter Diagnoses  Name Primary?   Essential hypertension Yes   Hyperlipidemia, unspecified hyperlipidemia type    Gastroesophageal reflux disease, unspecified whether esophagitis present    Not proficient in [place] language       -reviewed  labs with pt  -pt encouraged to get back on her statin -she is counseled that she needs to Update enrollment with [facility] -Rx sent to [facility].  If she signs up for [facility], she is to contact office and rx will be sent there -HCM up to date -pt to follow up 4 months.  She is to contact office sooner prn

## 2022-02-01 ENCOUNTER — Other Ambulatory Visit: Payer: Self-pay | Admitting: Physician Assistant

## 2022-02-01 MED ORDER — AMLODIPINE BESYLATE 5 MG PO TABS: ORAL_TABLET | ORAL | 3 refills | Status: DC

## 2022-02-17 ENCOUNTER — Telehealth: Payer: Self-pay | Admitting: Physician Assistant

## 2022-02-17 NOTE — Telephone Encounter (Signed)
Pt has been notified that she needs to Re-screen with [facility] before Her appt on 03/09/2022 ?

## 2022-03-09 ENCOUNTER — Ambulatory Visit: Payer: No Typology Code available for payment source | Admitting: Physician Assistant

## 2022-04-29 IMAGING — MG MM DIGITAL SCREENING BILAT W/ TOMO AND CAD
6 of 12 series · 6 of 36 positions shown · non-contrast
Comparison: Previous exam(s).

CLINICAL DATA: Screening.

EXAM:
DIGITAL SCREENING BILATERAL MAMMOGRAM WITH TOMOSYNTHESIS AND CAD
TECHNIQUE: Bilateral screening digital craniocaudal and mediolateral oblique
mammograms were obtained. Bilateral screening digital breast
tomosynthesis was performed. The images were evaluated with
computer-aided detection.

[L MLO synth-2D (1 of 2)]
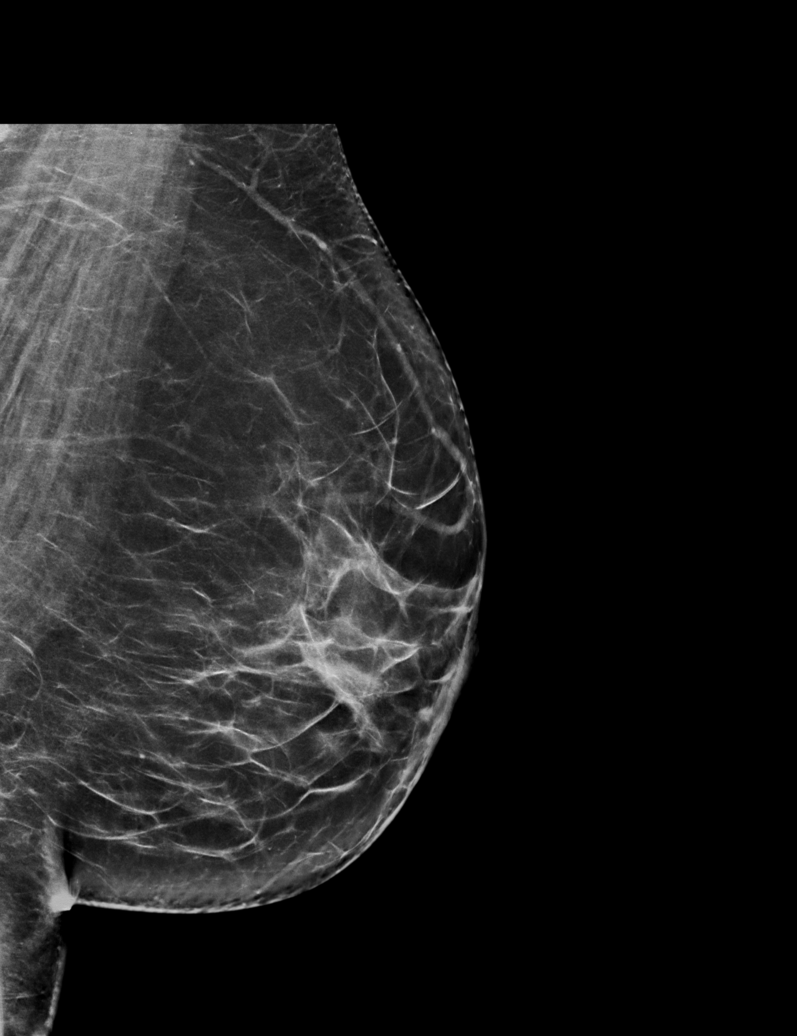

[R MLO synth-2D (1 of 2)]
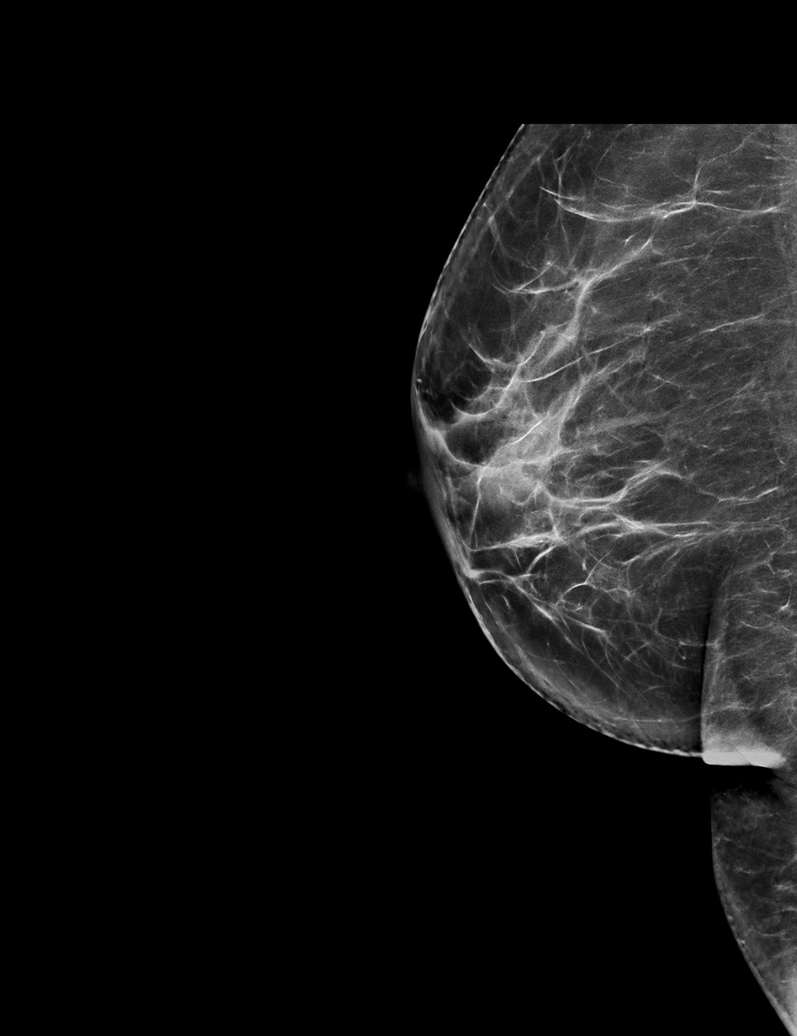

[L MLO synth-2D (2 of 2)]
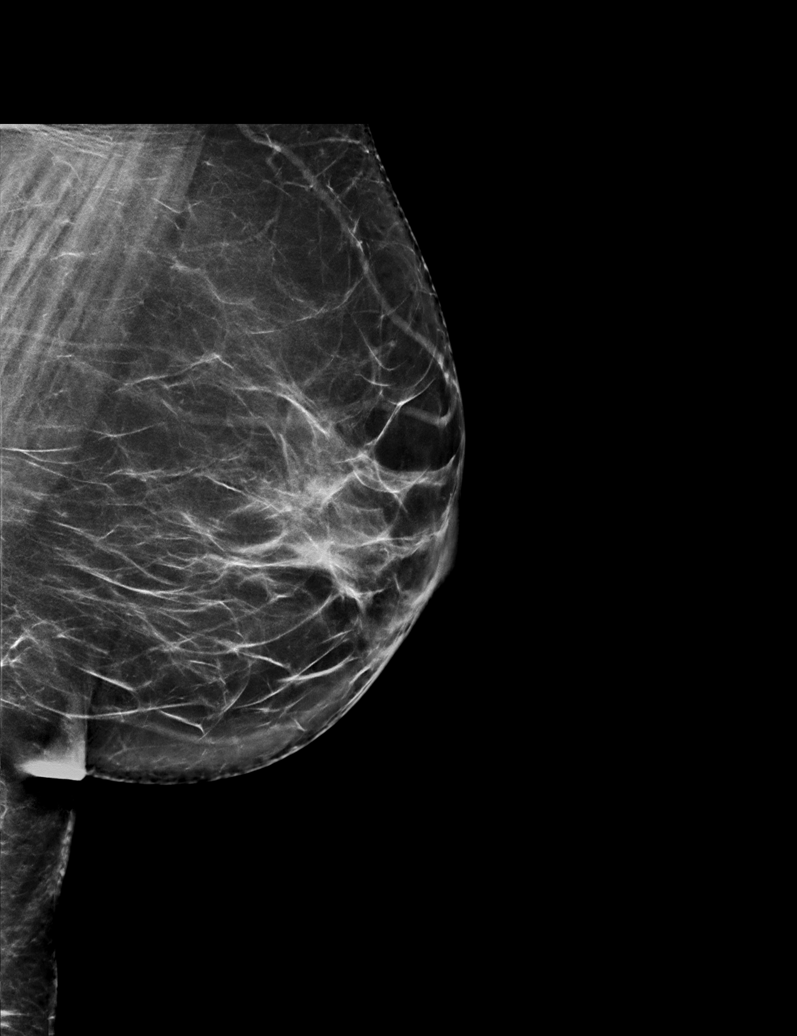

[R MLO synth-2D (2 of 2)]
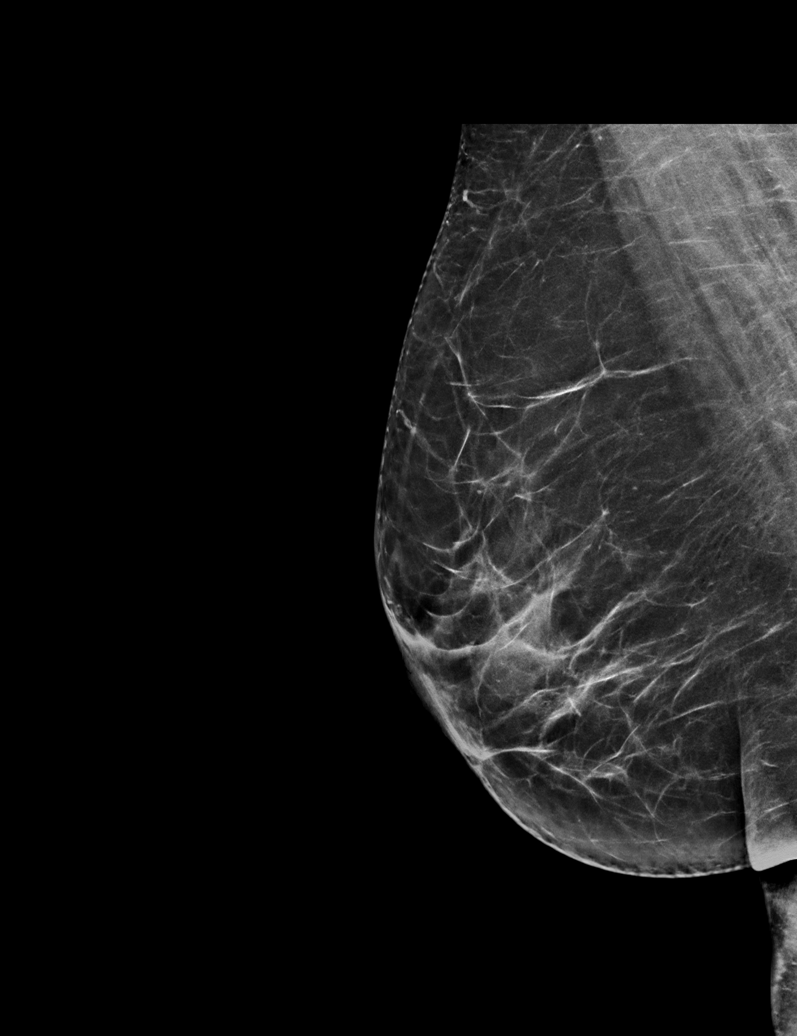

[L CC synth-2D]
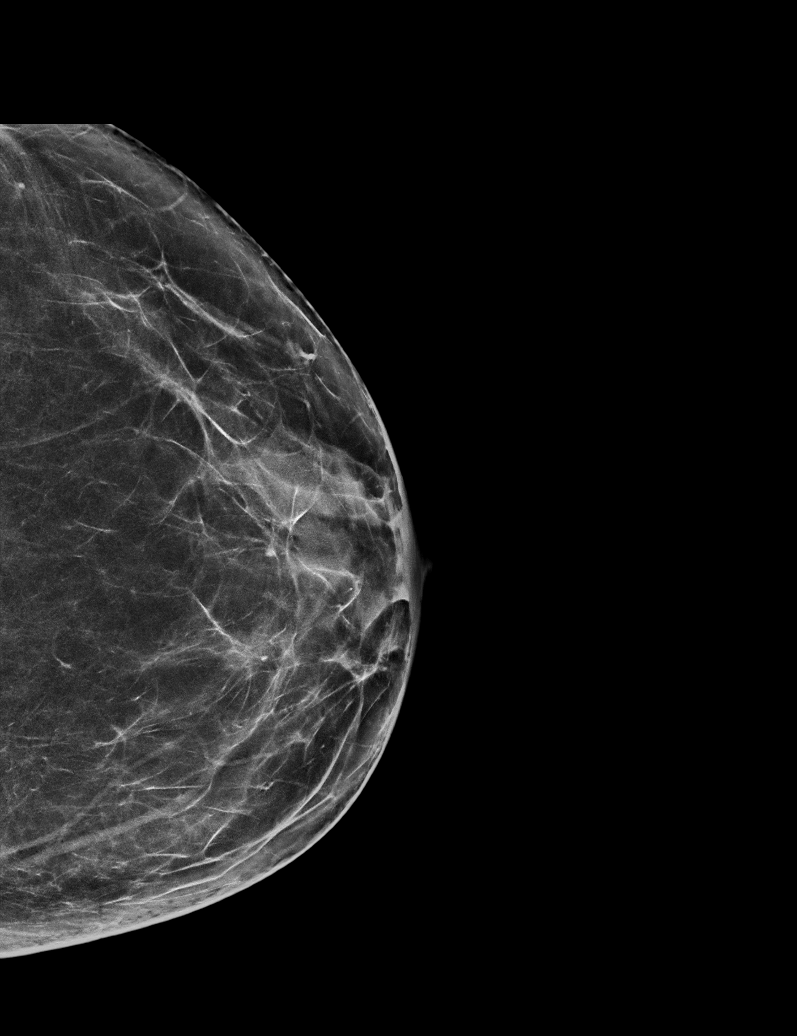

[R CC synth-2D]
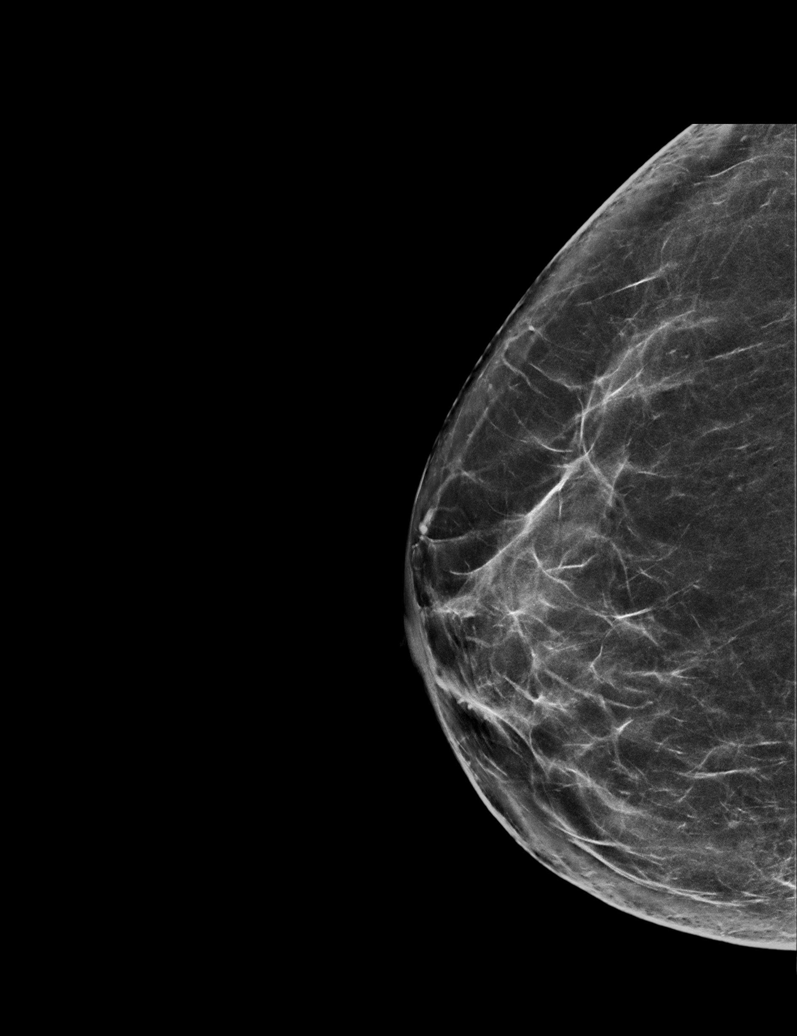

[6 of 36 positions shown; findings below may reference images not displayed]

ACR Breast Density Category b: There are scattered areas of
fibroglandular density.
FINDINGS: There are no findings suspicious for malignancy. The images were
evaluated with computer-aided detection.
IMPRESSION: No mammographic evidence of malignancy. A result letter of this
screening mammogram will be mailed directly to the patient.

RECOMMENDATION:
Screening mammogram in one year. (Code:[id number]-[id number])

BI-RADS CATEGORY  1: Negative.

## 2022-09-01 ENCOUNTER — Emergency Department (HOSPITAL_COMMUNITY)
Admission: EM | Admit: 2022-09-01 | Discharge: 2022-09-01 | Disposition: A | Discharge status: Home / Self Care | Payer: No Typology Code available for payment source | Attending: Emergency Medicine | Admitting: Emergency Medicine

## 2022-09-01 ENCOUNTER — Other Ambulatory Visit: Payer: Self-pay

## 2022-09-01 ENCOUNTER — Emergency Department (HOSPITAL_COMMUNITY): Payer: No Typology Code available for payment source

## 2022-09-01 ENCOUNTER — Encounter (HOSPITAL_COMMUNITY): Payer: Self-pay | Admitting: Emergency Medicine

## 2022-09-01 DIAGNOSIS — Y92410 Unspecified street and highway as the place of occurrence of the external cause: Secondary | ICD-10-CM | POA: Insufficient documentation

## 2022-09-01 DIAGNOSIS — S299XXA Unspecified injury of thorax, initial encounter: Secondary | ICD-10-CM | POA: Insufficient documentation

## 2022-09-01 DIAGNOSIS — S3992XA Unspecified injury of lower back, initial encounter: Secondary | ICD-10-CM | POA: Insufficient documentation

## 2022-09-01 DIAGNOSIS — M7918 Myalgia, other site: Secondary | ICD-10-CM

## 2022-09-01 DIAGNOSIS — S0993XA Unspecified injury of face, initial encounter: Secondary | ICD-10-CM | POA: Insufficient documentation

## 2022-09-01 MED ORDER — TRAMADOL HCL 50 MG PO TABS: 50.0000 mg | ORAL_TABLET | Freq: Four times a day (QID) | ORAL | 0 refills | Status: DC | PRN
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
## 2022-09-01 NOTE — ED Notes (Signed)
ED Provider at bedside. 

## 2022-09-01 NOTE — ED Notes (Signed)
Patient transported to X-ray 
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
## 2022-09-01 NOTE — ED Notes (Signed)
Pt ambulated to restroom X 1 assist. 

## 2023-03-22 DIAGNOSIS — Z139 Encounter for screening, unspecified: Secondary | ICD-10-CM

## 2023-03-22 LAB — GLUCOSE, POCT (MANUAL RESULT ENTRY): POC Glucose: 81 mg/dl (ref 70–99)
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
## 2023-03-23 ENCOUNTER — Encounter: Payer: Self-pay | Admitting: Physician Assistant

## 2023-03-23 ENCOUNTER — Ambulatory Visit: Payer: No Typology Code available for payment source | Admitting: Physician Assistant

## 2023-03-23 VITALS — BP 138/85 | HR 71 | Temp 97.9°F | Ht 61.0 in | Wt 150.0 lb

## 2023-03-23 DIAGNOSIS — E785 Hyperlipidemia, unspecified: Secondary | ICD-10-CM

## 2023-03-23 DIAGNOSIS — Z789 Other specified health status: Secondary | ICD-10-CM

## 2023-03-23 DIAGNOSIS — I10 Essential (primary) hypertension: Secondary | ICD-10-CM

## 2023-03-23 DIAGNOSIS — Z1239 Encounter for other screening for malignant neoplasm of breast: Secondary | ICD-10-CM

## 2023-03-23 DIAGNOSIS — K219 Gastro-esophageal reflux disease without esophagitis: Secondary | ICD-10-CM

## 2023-03-23 DIAGNOSIS — Z1211 Encounter for screening for malignant neoplasm of colon: Secondary | ICD-10-CM

## 2023-03-23 DIAGNOSIS — Z7689 Persons encountering health services in other specified circumstances: Secondary | ICD-10-CM

## 2023-03-23 MED ORDER — AMLODIPINE BESYLATE 5 MG PO TABS: ORAL_TABLET | ORAL | 0 refills | Status: DC

## 2023-03-23 MED ORDER — ATORVASTATIN CALCIUM 20 MG PO TABS: ORAL_TABLET | ORAL | 0 refills | Status: DC

## 2023-03-23 MED ORDER — OMEPRAZOLE 40 MG PO CPDR: 40.0000 mg | DELAYED_RELEASE_CAPSULE | Freq: Every day | ORAL | 0 refills | Status: DC | PRN

## 2023-03-23 MED ORDER — AMLODIPINE BESYLATE 5 MG PO TABS: ORAL_TABLET | ORAL | 3 refills | Status: DC

## 2023-03-23 NOTE — Progress Notes (Signed)
BP 138/85   Pulse 71   Temp 97.9 F (36.6 C)   Ht  (1.549 m)   Wt 150 lb (68 kg)   SpO2 98%   BMI 28.34 kg/m    Subjective:    Patient ID: Karina Keller, female    DOB: 07/20/1966, 57 y.o.   MRN: [id number]  HPI: Karina Keller is a 57 y.o. female presenting on 03/23/2023 for New Patient (Initial Visit) (Pt is here to re-establish care - she was last seen here 11-11-2023. Pt has been out of all her medications for months. Pt states she is feeling well. )   HPI  Chief Complaint  Patient presents with   New Patient (Initial Visit)    Pt is here to re-establish care - she was last seen here 11-11-2023. Pt has been out of all her medications for months. Pt states she is feeling well.      Pt is 56yoF who presents to re-establish care.  She was last seen in this office 11/10/2021.   At that time, she was being treated for HTN, dyslipidemia and GERD.   She says she did not receive care at another facility in the interim.     She says she is doing well.  She has issues occasionally with her GERD.    Relevant past medical, surgical, family and social history reviewed and updated as indicated. Interim medical history since our last visit reviewed. Allergies and medications reviewed and updated.  CURRENT MEDS: none  Review of Systems  Per HPI unless specifically indicated above     Objective:    BP 138/85   Pulse 71   Temp 97.9 F (36.6 C)   Ht  (1.549 m)   Wt 150 lb (68 kg)   SpO2 98%   BMI 28.34 kg/m   Wt Readings from Last 3 Encounters:  03/23/23 150 lb (68 kg)  09/01/22 149 lb 14.6 oz (68 kg)  11/10/21 151 lb (68.5 kg)    Physical Exam Vitals reviewed.  Constitutional:      General: She is not in acute distress.    Appearance: She is well-developed. She is not ill-appearing or toxic-appearing.  HENT:     Head: Normocephalic and atraumatic.     Right Ear: Tympanic membrane, ear canal and external ear normal.     Left Ear: Tympanic  membrane, ear canal and external ear normal.  Eyes:     Extraocular Movements: Extraocular movements intact.     Conjunctiva/sclera: Conjunctivae normal.     Pupils: Pupils are equal, round, and reactive to light.  Neck:     Thyroid: No thyromegaly.  Cardiovascular:     Rate and Rhythm: Normal rate and regular rhythm.  Pulmonary:     Effort: Pulmonary effort is normal.     Breath sounds: Normal breath sounds.  Abdominal:     General: Bowel sounds are normal.     Palpations: Abdomen is soft. There is no hepatomegaly, splenomegaly, mass or pulsatile mass.     Tenderness: There is no abdominal tenderness. There is no guarding or rebound.  Musculoskeletal:     Cervical back: Neck supple.     Right lower leg: No edema.     Left lower leg: No edema.  Lymphadenopathy:     Cervical: No cervical adenopathy.  Skin:    General: Skin is warm and dry.  Neurological:     Mental Status: She is alert and oriented to person, place, and  time.     Motor: No weakness or tremor.     Gait: Gait is intact. Gait normal.     Deep Tendon Reflexes:     Reflex Scores:      Patellar reflexes are 2+ on the right side and 2+ on the left side. Psychiatric:        Attention and Perception: Attention normal.        Speech: Speech normal.        Behavior: Behavior normal. Behavior is cooperative.     Comments: Pleasant and engaged           Assessment & Plan:    Encounter Diagnoses  Name Primary?   Encounter to establish care Yes   Essential hypertension    Hyperlipidemia, unspecified hyperlipidemia type    Not proficient in English language    Screening for colon cancer    Encounter for screening for malignant neoplasm of breast, unspecified screening modality    Gastroesophageal reflux disease, unspecified whether esophagitis present       -will Update labs -pt was given FIT test for colon cancer screening -pt was referred for screening mammogram -her PAP is due for update.   -Renewed  meds:  -to [facility]- amlodipine, atorvastatin omeprazel -to [facility] amldoipine so she can start on that today -pt will follow up 1 month to recheck bp, review labs and update PAP.  She is to contact office sooner prn

## 2023-03-25 ENCOUNTER — Other Ambulatory Visit: Payer: Self-pay | Admitting: Physician Assistant

## 2023-03-25 ENCOUNTER — Telehealth: Payer: Self-pay

## 2023-03-25 DIAGNOSIS — Z1231 Encounter for screening mammogram for malignant neoplasm of breast: Secondary | ICD-10-CM

## 2023-03-25 NOTE — Telephone Encounter (Signed)
Telephoned patient using interpreter [phone]. Patient ineligible for [facility] program. Advised patient to schedule mammogram appointment.

## 2023-04-19 ENCOUNTER — Other Ambulatory Visit: Payer: Self-pay | Admitting: Physician Assistant

## 2023-04-19 ENCOUNTER — Other Ambulatory Visit (HOSPITAL_COMMUNITY)
Admission: RE | Admit: 2023-04-19 | Discharge: 2023-04-19 | Disposition: A | Discharge status: Home / Self Care | Payer: Self-pay | Source: Ambulatory Visit | Attending: Physician Assistant | Admitting: Physician Assistant

## 2023-04-19 DIAGNOSIS — Z1211 Encounter for screening for malignant neoplasm of colon: Secondary | ICD-10-CM

## 2023-04-19 DIAGNOSIS — E785 Hyperlipidemia, unspecified: Secondary | ICD-10-CM | POA: Insufficient documentation

## 2023-04-19 DIAGNOSIS — I10 Essential (primary) hypertension: Secondary | ICD-10-CM | POA: Insufficient documentation

## 2023-04-19 LAB — LIPID PANEL
Cholesterol: 238 mg/dL — ABNORMAL HIGH (ref 0–200)
HDL: 65 mg/dL (ref >40)
LDL Cholesterol: 155 mg/dL — ABNORMAL HIGH (ref 0–99)
Total CHOL/HDL Ratio: 3.7 RATIO
Triglycerides: 91 mg/dL (ref <150)
VLDL: 18 mg/dL (ref 0–40)

## 2023-04-19 LAB — COMPREHENSIVE METABOLIC PANEL
ALT: 35 U/L (ref 0–44)
AST: 38 U/L (ref 15–41)
Albumin: 3.9 g/dL (ref 3.5–5.0)
Alkaline Phosphatase: 98 U/L (ref 38–126)
Anion gap: 6 (ref 5–15)
BUN: 12 mg/dL (ref 6–20)
CO2: 28 mmol/L (ref 22–32)
Calcium: 8.8 mg/dL — ABNORMAL LOW (ref 8.9–10.3)
Chloride: 103 mmol/L (ref 98–111)
Creatinine, Ser: 0.59 mg/dL (ref 0.44–1.00)
GFR, Estimated: >60 mL/min (ref >60)
Glucose, Bld: 99 mg/dL (ref 70–99)
Potassium: 3.3 mmol/L — ABNORMAL LOW (ref 3.5–5.1)
Sodium: 137 mmol/L (ref 135–145)
Total Bilirubin: 1 mg/dL (ref 0.3–1.2)
Total Protein: 7.6 g/dL (ref 6.5–8.1)

## 2023-04-19 LAB — POC FIT TEST STOOL: Fecal Occult Blood: NEGATIVE

## 2023-04-26 ENCOUNTER — Other Ambulatory Visit (HOSPITAL_COMMUNITY)
Admission: RE | Admit: 2023-04-26 | Discharge: 2023-04-26 | Disposition: A | Discharge status: Home / Self Care | Payer: Self-pay | Source: Ambulatory Visit | Attending: Physician Assistant | Admitting: Physician Assistant

## 2023-04-26 ENCOUNTER — Ambulatory Visit: Payer: No Typology Code available for payment source | Admitting: Physician Assistant

## 2023-04-26 ENCOUNTER — Encounter: Payer: Self-pay | Admitting: Physician Assistant

## 2023-04-26 VITALS — BP 111/73 | HR 79 | Temp 98.4°F | Ht 61.0 in | Wt 146.5 lb

## 2023-04-26 DIAGNOSIS — I10 Essential (primary) hypertension: Secondary | ICD-10-CM

## 2023-04-26 DIAGNOSIS — Z124 Encounter for screening for malignant neoplasm of cervix: Secondary | ICD-10-CM

## 2023-04-26 DIAGNOSIS — Z1239 Encounter for other screening for malignant neoplasm of breast: Secondary | ICD-10-CM

## 2023-04-26 DIAGNOSIS — Z972 Presence of dental prosthetic device (complete) (partial): Secondary | ICD-10-CM

## 2023-04-26 DIAGNOSIS — E785 Hyperlipidemia, unspecified: Secondary | ICD-10-CM

## 2023-04-26 DIAGNOSIS — K219 Gastro-esophageal reflux disease without esophagitis: Secondary | ICD-10-CM

## 2023-04-26 MED ORDER — OMEPRAZOLE 40 MG PO CPDR: 40.0000 mg | DELAYED_RELEASE_CAPSULE | Freq: Every day | ORAL | 3 refills | Status: DC | PRN

## 2023-04-26 MED ORDER — ATORVASTATIN CALCIUM 20 MG PO TABS: ORAL_TABLET | ORAL | 3 refills | Status: DC

## 2023-04-26 MED ORDER — AMLODIPINE BESYLATE 5 MG PO TABS: ORAL_TABLET | ORAL | 3 refills | Status: DC

## 2023-04-26 NOTE — Progress Notes (Signed)
BP 111/73   Pulse 79   Temp 98.4 F (36.9 C)   Wt 146 lb 8 oz (66.5 kg)   SpO2 97%   BMI 27.68 kg/m    Subjective:    Patient ID: Karina Keller, female    DOB: 04/09/1966, 57 y.o.   MRN: [id number]  HPI: Karina Keller is a 57 y.o. female presenting on 04/26/2023 for Hypertension, Hyperlipidemia, Gynecologic Exam, and odor (Pt has concerns about having a bad odor - pt is unsure if it her stomach, or her mouth. Pt states people have told her it is her breathing. Pt states she has a dental plaque and she brushes and cleans her mouth twice daily. Pt is concerned that it may be tonsil stones or coming from her stomach)   HPI  Chief Complaint  Patient presents with   Hypertension   Hyperlipidemia   Gynecologic Exam   odor    Pt has concerns about having a bad odor - pt is unsure if it her stomach, or her mouth. Pt states people have told her it is her breathing. Pt states she has a dental plaque and she brushes and cleans her mouth twice daily. Pt is concerned that it may be tonsil stones or coming from her stomach     She has dentures  LMP 2021 She has 2 children     Relevant past medical, surgical, family and social history reviewed and updated as indicated. Interim medical history since our last visit reviewed. Allergies and medications reviewed and updated.    Current Outpatient Medications:    amLODipine (NORVASC) 5 MG tablet, 1 po qd.  Tome una tableta por boca diaria, Disp: 30 tablet, Rfl: 3   atorvastatin (LIPITOR) 20 MG tablet, TAKE 1 Tablet BY MOUTH ONCE EVERY NIGHT AT BEDTIME.  Tome una tableta por boca al dormir, Disp: 30 tablet, Rfl: 3   omeprazole (PRILOSEC) 40 MG capsule, Take 1 capsule (40 mg total) by mouth daily as needed. Tome una capsula por boca diaria cuando sea necesario, Disp: 30 capsule, Rfl: 3    Review of Systems  Per HPI unless specifically indicated above     Objective:    BP 111/73   Pulse 79   Temp 98.4 F (36.9 C)   Wt  146 lb 8 oz (66.5 kg)   SpO2 97%   BMI 27.68 kg/m   Wt Readings from Last 3 Encounters:  04/26/23 146 lb 8 oz (66.5 kg)  03/23/23 150 lb (68 kg)  09/01/22 149 lb 14.6 oz (68 kg)    Physical Exam Vitals and nursing note reviewed. Exam conducted with a chaperone present.  Constitutional:      General: She is not in acute distress.    Appearance: She is well-developed. She is not toxic-appearing.  HENT:     Head: Normocephalic and atraumatic.     Mouth/Throat:     Mouth: Mucous membranes are moist.     Dentition: Has dentures.     Tongue: No lesions. Tongue does not deviate from midline.     Palate: No mass and lesions.     Pharynx: Oropharynx is clear.  Pulmonary:     Effort: Pulmonary effort is normal.  Chest:  Breasts:    Right: Normal.     Left: Normal.  Abdominal:     Palpations: Abdomen is soft. There is no mass.     Tenderness: There is no abdominal tenderness. There is no guarding or rebound.  Genitourinary:  Labia:        Right: No rash, tenderness or lesion.        Left: No rash, tenderness or lesion.      Vagina: Normal.     Cervix: No cervical motion tenderness, discharge or friability.     Uterus: Normal.      Adnexa:        Right: No mass, tenderness or fullness.         Left: No mass, tenderness or fullness.       Comments: (Nurse Berenice assisted) Skin:    General: Skin is warm and dry.  Neurological:     Mental Status: She is alert and oriented to person, place, and time.  Psychiatric:        Behavior: Behavior normal.     Results for orders placed or performed during the hospital encounter of 04/19/23  Lipid panel  Result Value Ref Range   Cholesterol 238 (H) 0 - 200 mg/dL   Triglycerides 91 <562 mg/dL   HDL 65 >13 mg/dL   Total CHOL/HDL Ratio 3.7 RATIO   VLDL 18 0 - 40 mg/dL   LDL Cholesterol 086 (H) 0 - 99 mg/dL  Comprehensive metabolic panel  Result Value Ref Range   Sodium 137 135 - 145 mmol/L   Potassium 3.3 (L) 3.5 - 5.1 mmol/L    Chloride 103 98 - 111 mmol/L   CO2 28 22 - 32 mmol/L   Glucose, Bld 99 70 - 99 mg/dL   BUN 12 6 - 20 mg/dL   Creatinine, Ser 5.78 0.44 - 1.00 mg/dL   Calcium 8.8 (L) 8.9 - 10.3 mg/dL   Total Protein 7.6 6.5 - 8.1 g/dL   Albumin 3.9 3.5 - 5.0 g/dL   AST 38 15 - 41 U/L   ALT 35 0 - 44 U/L   Alkaline Phosphatase 98 38 - 126 U/L   Total Bilirubin 1.0 0.3 - 1.2 mg/dL   GFR, Estimated >46 >96 mL/min   Anion gap 6 5 - 15      Assessment & Plan:   Encounter Diagnoses  Name Primary?   Routine Papanicolaou smear Yes   Encounter for screening for malignant neoplasm of breast, unspecified screening modality    Essential hypertension    Hyperlipidemia, unspecified hyperlipidemia type    Gastroesophageal reflux disease, unspecified whether esophagitis present    Wears dentures      HTN -Reviewed labs with pt -continue amlodipine  Dyslipidemia -pt not taking atorvastatin due to meds from [facility] were never received.  Rx sent to [facility] and pt will start taking it for her lipids  Uninsured -discussed with [facility] and information conveyed to pt.  Fpl 295 so doesn't qualify for [facility] -discussed with pt as she will be responsible for at least a portion of all tests done through [facility].  Pt states understanding and wants to do what she needs for her health -Application will re-submitted to [facility] (pt to bring in proof of address)  Odor/dentures: -pt was counseled on measures to help with this and she is given reading information  HCM: -PAP updated -Screening mammogram ordered (pt ineligible for BCCCP) -reviewed results FIT test  Pt will follow up 3 months.  She is to contact office sooner prn

## 2023-04-26 NOTE — Patient Instructions (Signed)
Cuidados de la dentadura postiza [facility] Una dentadura postiza es un aparato dental extrable que reemplaza las piezas dentales perdidas o extradas. Una dentadura postiza completa reemplaza todos los dientes en la mandbula superior o inferior. Una dentadura postiza parcial se necesita cuando solo faltan algunos dientes. Una dentadura postiza inmediata es la que se coloca el mismo da que se extraen las piezas dentales. La dentadura postiza est hecha para verse como las piezas dentales naturales. Una dentadura postiza puede: Darle una sonrisa ms natural. Rellenar la cara en zonas donde faltan piezas dentales. Mejorar su capacidad de comer y hablar. Las bacterias y la placa pueden acumularse en una dentadura postiza, al igual que en las piezas dentales regulares. Practicar una buena higiene bucal y cuidar adecuadamente la dentadura postiza es importante para prevenir la irritacin o una infeccin en la boca y las encas. Cmo cuidar la dentadura postiza Use la dentadura postiza como se lo haya indicado el odontlogo. La [employer] de las dentaduras postizas deben retirarse todos los [address]  [address] para enjuagarlas, limpiarlas y Production assistant, radio. Es posible que Set designer la dentadura postiza durante la noche. Siga estas instrucciones para el cuidado de la dentadura postiza: Qutese la dentadura postiza despus de cada comida y enjuague todo resto de partculas de alimentos. Al enjuagar la dentadura postiza sobre el lavabo, coloque una toalla en el lavabo o llnelo con agua. Esto ayudar a prevenir daos en la dentadura postiza si se le cae. Limpie la dentadura [employer] con un cepillo de dientes de cerda [place] y un limpiador para dentaduras postizas no abrasivo. Pdale a su dentista que le recomiende una solucin de limpieza para dentadura postiza. Las soluciones de limpieza vienen en diversas formas, entre ellas comprimidos, cremas, pastas, geles y soluciones. Si [place] un [employer] dental, asegrese de  eliminar todo el [employer] de la boca y de la dentadura postiza cada da y reemplazarlo por Percival Spanish. Si no va a colocarse la dentadura postiza en la boca inmediatamente despus de limpiarla, colquela en un recipiente y asegrese de que est [place] con agua. Guarde el recipiente en un lugar seguro. Antes de colocarse la dentadura postiza en la boca: Cepllese las encas, la lengua y el paladar con dentfrico y un cepillo de cerdas suaves. Enjuague la dentadura postiza y colquesela nuevamente en la boca mientras est hmeda. Consejos generales [place] limpiar su dentadura postiza por su odontlogo al menos una vez por ao. Asegrese de visitar a su odontlogo para los controles de rutina, especialmente si tiene una dentadura postiza extrable que se apoya en piezas dentales naturales. No cepille la dentadura postiza con dentfrico. El dentfrico es demasiado abrasivo para las dentaduras postizas y puede daarlas. No se cepille la boca con un producto de limpieza para dentaduras postizas ni utilice un producto de limpieza para dentaduras postizas para limpiar la dentadura postiza dentro de la boca. Las soluciones de limpieza solo deben utilizarse fuera de la boca. No remoje las dentaduras postizas en lo siguiente: Agua hirviendo. Leja para dentaduras postizas durante ms de 10 minutos. No intente reparar con pegamento una dentadura postiza rota. No envuelva la dentadura postiza en una toalla de papel, servilleta o pauelos de papel. Podra desecharse accidentalmente. Comunquese con un mdico si: La dentadura postiza se daa. La dentadura postiza no encaja bien. Tiene irritacin, enrojecimiento, hinchazn u otra anomala en las encas o la boca. Resumen [place] dentadura postiza es un dispositivo removible que reemplaza las piezas dentales perdidas. No debe usar la dentadura postiza de da y  de noche. Qutese la dentadura postiza cada da para enjuagarla, limpiarla y remojarla. Use un cepillo  de dientes de cerda [place] y un limpiador para dentaduras postizas no abrasivo para limpiar la dentadura postiza. Antes de volver a colocarse la dentadura postiza en la boca, cepllese las encas, la lengua y el paladar con dentfrico y un cepillo de cerdas suaves. Visite al odontlogo con regularidad para los controles de [place]. Esta informacin no tiene Theme park manager el consejo del mdico. Asegrese de hacerle al mdico cualquier pregunta que tenga. Document Revised: 04/24/2021 Document Reviewed: 04/24/2021 Elsevier Patient Education  2024 [employer].

## 2023-04-28 LAB — CYTOLOGY - PAP
Adequacy: ABSENT
Comment: NEGATIVE
Diagnosis: NEGATIVE
High risk HPV: NEGATIVE

## 2023-05-04 ENCOUNTER — Other Ambulatory Visit: Payer: Self-pay | Admitting: Physician Assistant

## 2023-05-04 MED ORDER — METRONIDAZOLE 500 MG PO TABS: 500.0000 mg | ORAL_TABLET | Freq: Two times a day (BID) | ORAL | 0 refills | Status: AC

## 2023-05-06 ENCOUNTER — Ambulatory Visit (HOSPITAL_COMMUNITY): Payer: Self-pay

## 2023-06-27 ENCOUNTER — Other Ambulatory Visit: Payer: Self-pay | Admitting: Physician Assistant

## 2023-06-27 MED ORDER — AMLODIPINE BESYLATE 5 MG PO TABS: ORAL_TABLET | ORAL | 3 refills | Status: DC

## 2023-07-04 ENCOUNTER — Other Ambulatory Visit: Payer: Self-pay | Admitting: Physician Assistant

## 2023-07-04 DIAGNOSIS — E785 Hyperlipidemia, unspecified: Secondary | ICD-10-CM

## 2023-07-04 DIAGNOSIS — I10 Essential (primary) hypertension: Secondary | ICD-10-CM

## 2023-07-26 ENCOUNTER — Ambulatory Visit: Payer: No Typology Code available for payment source | Admitting: Physician Assistant

## 2023-08-02 ENCOUNTER — Ambulatory Visit: Payer: No Typology Code available for payment source | Admitting: Physician Assistant

## 2023-08-02 ENCOUNTER — Encounter: Payer: Self-pay | Admitting: Physician Assistant

## 2023-08-02 ENCOUNTER — Other Ambulatory Visit (HOSPITAL_COMMUNITY)
Admission: RE | Admit: 2023-08-02 | Discharge: 2023-08-02 | Disposition: A | Discharge status: Home / Self Care | Payer: Self-pay | Source: Ambulatory Visit | Attending: Physician Assistant | Admitting: Physician Assistant

## 2023-08-02 VITALS — BP 155/94 | HR 62 | Temp 97.2°F | Ht 61.0 in | Wt 143.8 lb

## 2023-08-02 DIAGNOSIS — I10 Essential (primary) hypertension: Secondary | ICD-10-CM | POA: Insufficient documentation

## 2023-08-02 DIAGNOSIS — E876 Hypokalemia: Secondary | ICD-10-CM

## 2023-08-02 DIAGNOSIS — E785 Hyperlipidemia, unspecified: Secondary | ICD-10-CM

## 2023-08-02 DIAGNOSIS — Z789 Other specified health status: Secondary | ICD-10-CM

## 2023-08-02 DIAGNOSIS — Z1239 Encounter for other screening for malignant neoplasm of breast: Secondary | ICD-10-CM

## 2023-08-02 LAB — COMPREHENSIVE METABOLIC PANEL
ALT: 35 U/L (ref 0–44)
AST: 33 U/L (ref 15–41)
Albumin: 3.9 g/dL (ref 3.5–5.0)
Alkaline Phosphatase: 108 U/L (ref 38–126)
Anion gap: 9 (ref 5–15)
BUN: 12 mg/dL (ref 6–20)
CO2: 29 mmol/L (ref 22–32)
Calcium: 8.8 mg/dL — ABNORMAL LOW (ref 8.9–10.3)
Chloride: 100 mmol/L (ref 98–111)
Creatinine, Ser: 0.58 mg/dL (ref 0.44–1.00)
GFR, Estimated: >60 mL/min (ref >60)
Glucose, Bld: 100 mg/dL — ABNORMAL HIGH (ref 70–99)
Potassium: 3.2 mmol/L — ABNORMAL LOW (ref 3.5–5.1)
Sodium: 138 mmol/L (ref 135–145)
Total Bilirubin: 0.8 mg/dL (ref 0.3–1.2)
Total Protein: 7.3 g/dL (ref 6.5–8.1)

## 2023-08-02 LAB — LIPID PANEL
Cholesterol: 218 mg/dL — ABNORMAL HIGH (ref 0–200)
HDL: 62 mg/dL (ref >40)
LDL Cholesterol: 138 mg/dL — ABNORMAL HIGH (ref 0–99)
Total CHOL/HDL Ratio: 3.5 ratio
Triglycerides: 90 mg/dL (ref <150)
VLDL: 18 mg/dL (ref 0–40)

## 2023-08-02 MED ORDER — AMLODIPINE BESYLATE 5 MG PO TABS: ORAL_TABLET | ORAL | 3 refills | Status: DC

## 2023-08-02 MED ORDER — ATORVASTATIN CALCIUM 20 MG PO TABS: ORAL_TABLET | ORAL | 3 refills | Status: DC

## 2023-08-02 NOTE — Patient Instructions (Signed)
Contenido de potasio de los alimentos Potassium Content of Foods  El potasio es un mineral que se encuentra en muchos alimentos y bebidas. Puede incidir en el funcionamiento del corazn, afectar la presin arterial y Pharmacologist el equilibrio de los lquidos y Customer service manager en el cuerpo. Es importante no tener demasiado potasio (hiperpotasemia) ni muy poco potasio (hipopotasemia) en el cuerpo, especialmente en la sangre. El potasio se encuentra naturalmente en muchos tipos diferentes de alimentos integrales, como frutas, verduras, carnes y productos lcteos. Los alimentos procesados tienden a Warehouse manager un contenido ms bajo de potasio. La cantidad de potasio que necesita diariamente depende de su edad y de las afecciones mdicas que pudiera tener. Las recomendaciones generales son las siguientes: Mujeres de 19 aos o ms: 2600 mg al [employer]. Hombres de 19 aos o ms: 3400 mg al da. Hable con su mdico o nutricionista sobre la cantidad de potasio que necesita. Qu alimentos son ricos en potasio? A continuacin se muestran ejemplos de alimentos que tienen ms de 200 mg de potasio por porcin. Frutas Naranja: 1 mediana (130 g) contiene 230 mg de potasio. Banana: 1 mediana (120 g) contiene 420 mg de potasio Meln cantalupo, trozos: 1 taza (160 g) contiene 430 mg de potasio. Lyndle Herrlich, asada, sin cscara: 1 mediana (170 g) contiene 600 mg de potasio. Brcoli, picado, cocido:  taza (77.5 g) contiene 230 mg de potasio. Tomate, picado o cortado en rodajas: 1 taza (152 g) contiene 400 mg de potasio. Granos Cereales, salvado con uvas pasas: 1 taza (59 g) contiene 360 mg de potasio. Granola con almendras:  taza (82 g) contiene 220 mg de potasio. Carnes y otras protenas [place] de carne molida: 4 onzas (113 g) contiene 240 mg de potasio. Frijoles colorados, hervidos:  taza (130 g) contiene 350 mg de potasio  Almendras: 1 onza (aproximadamente 22 frutos secos o 28 g) contiene 200 mg de  potasio. Lcteos Leche de vaca, 1 %: 1 taza (237 ml) contiene 360 mg de potasio. Yogur bajo en grasa de vainilla simple: 3?4 taza (184 g) contiene 220 mg de potasio. Es posible que los productos que se enumeran ms [place] no constituyan una lista completa de los alimentos con alto contenido de Government social research officer. Las cantidades reales de potasio pueden ser diferentes segn la maduracin, la vida til y la preparacin de los alimentos. Consulte a un nutricionista para obtener ms informacin. Qu alimentos tienen un bajo contenido de potasio? A continuacin se muestran ejemplos de alimentos que tienen menos de 200 mg de potasio por porcin. Nils Pyle Arndanos: 1 taza (145 g) contiene 110 mg de potasio. Manzana: 1 mediana (140 g) contiene 145 mg de potasio. Uvas: 1 taza (160 g) contiene 175 mg de potasio. Verduras Repollo, crudo: 1 taza (70 g) contiene 120 mg de potasio. Coliflor, picada, cocida: 1 taza (180 g) contiene 90 mg de potasio. Deatra James romana picada: 1 taza (56 g) contiene 120 mg de potasio. Granos Bagel, simple: uno de 4 pulgadas (10 cm) contiene 100 mg de potasio. Pan de trigo integral: 1 rebanada (26 g) contiene 70 mg de potasio.Leeann Must, cocido: 1 taza (163 g) contiene 50 mg de potasio. Carnes y otras protenas Atn, ligero, enlatado en agua: 3 onzas (85 g) contienen 150 mg de potasio. Huevo, frito: 1 grande (50 g) contiene 60 mg de potasio. Man: 1 onza (35 frutos secos o 28 g) contiene 180 mg de potasio. Tofu:  taza (252 g) contiene 150 mg de potasio. Lcteos Queso (cheddar, colby, mozzarella o provolone): 1 onza (  28 g) contiene entre 30 y 40 mg de potasio. Es posible que los productos enumerados anteriormente no sean una lista completa de los alimentos con bajo contenido de Lyman. Las cantidades reales de potasio pueden ser diferentes segn la maduracin, la vida til y la preparacin de los alimentos. Consulte a un nutricionista para obtener ms informacin. Resumen El  potasio es un mineral que se encuentra en muchos alimentos y bebidas. Incide en el funcionamiento del corazn, afecta la presin arterial y mantiene el equilibrio de los lquidos y Customer service manager en el cuerpo. La cantidad de potasio que necesita diariamente depende de su edad y de las afecciones mdicas que pudiera tener. El mdico o el nutricionista le recomendarn la cantidad de potasio que debe ingerir por Futures trader. Esta informacin no tiene Theme park manager el consejo del mdico. Asegrese de hacerle al mdico cualquier pregunta que tenga. Document Revised: 08/24/2021 Document Reviewed: 08/24/2021 Elsevier Patient Education  2024 [employer].

## 2023-08-02 NOTE — Progress Notes (Signed)
BP (!) 155/94   Pulse 62   Temp (!) 97.2 F (36.2 C)   Ht 5\' 1"  (1.549 m)   Wt 143 lb 12 oz (65.2 kg)   SpO2 98%   BMI 27.16 kg/m    Subjective:    Patient ID: Karina Keller, female    DOB: [birth year], 57 y.o.   MRN: [id number]  HPI: Karina Keller is a 57 y.o. female presenting on 08/02/2023 for Hypertension (Pt ran out of her htn med 2-3 days ago.) and Hyperlipidemia   HPI   Chief Complaint  Patient presents with   Hypertension    Pt ran out of her htn med 2-3 days ago.   Hyperlipidemia    Pt is doing well today.   She feels good and has no complaints.  She ran out of her bp med several days ago.  She says she forgets to take her cholesterol med frequently.       Relevant past medical, surgical, family and social history reviewed and updated as indicated. Interim medical history since our last visit reviewed. Allergies and medications reviewed and updated.   Current Outpatient Medications:    omeprazole (PRILOSEC) 40 MG capsule, Take 1 capsule (40 mg total) by mouth daily as needed. Tome una capsula por boca diaria cuando sea necesario, Disp: 30 capsule, Rfl: 3   amLODipine (NORVASC) 5 MG tablet, 1 po qd.  Tome una tableta por boca diaria (Patient not taking: Reported on 08/02/2023), Disp: 30 tablet, Rfl: 3   atorvastatin (LIPITOR) 20 MG tablet, TAKE 1 Tablet BY MOUTH ONCE EVERY NIGHT AT BEDTIME.  Tome una tableta por boca al dormir (Patient not taking: Reported on 08/02/2023), Disp: 30 tablet, Rfl: 3    Review of Systems  Per HPI unless specifically indicated above     Objective:    BP (!) 155/94   Pulse 62   Temp (!) 97.2 F (36.2 C)   Ht 5\' 1"  (1.549 m)   Wt 143 lb 12 oz (65.2 kg)   SpO2 98%   BMI 27.16 kg/m   Wt Readings from Last 3 Encounters:  08/02/23 143 lb 12 oz (65.2 kg)  04/26/23 146 lb 8 oz (66.5 kg)  03/23/23 150 lb (68 kg)    Physical Exam Vitals reviewed.  Constitutional:      General: She is not in acute distress.     Appearance: She is well-developed. She is not toxic-appearing.  HENT:     Head: Normocephalic and atraumatic.     Right Ear: Tympanic membrane, ear canal and external ear normal.     Left Ear: Tympanic membrane, ear canal and external ear normal.  Eyes:     Extraocular Movements: Extraocular movements intact.     Conjunctiva/sclera: Conjunctivae normal.     Pupils: Pupils are equal, round, and reactive to light.  Cardiovascular:     Rate and Rhythm: Normal rate and regular rhythm.  Pulmonary:     Effort: Pulmonary effort is normal.     Breath sounds: Normal breath sounds.  Abdominal:     General: Bowel sounds are normal.     Palpations: Abdomen is soft. There is no mass.     Tenderness: There is no abdominal tenderness.  Musculoskeletal:     Cervical back: Neck supple.     Right lower leg: No edema.     Left lower leg: No edema.  Lymphadenopathy:     Cervical: No cervical adenopathy.  Skin:    General:  Skin is warm and dry.  Neurological:     Mental Status: She is alert and oriented to person, place, and time.     Motor: No weakness or tremor.     Gait: Gait is intact. Gait normal.  Psychiatric:        Behavior: Behavior normal.           Assessment & Plan:    Encounter Diagnoses  Name Primary?   Essential hypertension Yes   Hyperlipidemia, unspecified hyperlipidemia type    Encounter for screening for malignant neoplasm of breast, unspecified screening modality    Hypokalemia    Not proficient in [place] language      -reviewed labs with pt -pt was Counseled to avoid running out of meds -discussed with pt that she can take her chol med with her bp med if that will help her to take it every day -her screening mammogram is rescheduled.  She doesn't qualify for [facility]- she will self- pay mammogram -she is given Application for cafa -she is encouraged to eat diet hight in [place]+. She is given reading information -pt to follow up in 6 weeks.  She is to contact office  sooner prn

## 2023-08-31 ENCOUNTER — Ambulatory Visit (HOSPITAL_COMMUNITY)
Admission: RE | Admit: 2023-08-31 | Discharge: 2023-08-31 | Disposition: A | Discharge status: Home / Self Care | Payer: Self-pay | Source: Ambulatory Visit | Attending: Physician Assistant | Admitting: Physician Assistant

## 2023-08-31 DIAGNOSIS — Z1231 Encounter for screening mammogram for malignant neoplasm of breast: Secondary | ICD-10-CM | POA: Insufficient documentation

## 2023-08-31 DIAGNOSIS — Z1239 Encounter for other screening for malignant neoplasm of breast: Secondary | ICD-10-CM

## 2023-09-13 ENCOUNTER — Encounter: Payer: Self-pay | Admitting: Physician Assistant

## 2023-09-13 ENCOUNTER — Ambulatory Visit: Payer: No Typology Code available for payment source | Admitting: Physician Assistant

## 2023-09-13 VITALS — BP 126/83 | HR 72 | Temp 97.9°F | Wt 145.0 lb

## 2023-09-13 DIAGNOSIS — Z789 Other specified health status: Secondary | ICD-10-CM

## 2023-09-13 DIAGNOSIS — I10 Essential (primary) hypertension: Secondary | ICD-10-CM

## 2023-09-13 MED ORDER — ATORVASTATIN CALCIUM 20 MG PO TABS: ORAL_TABLET | ORAL | 3 refills | Status: DC

## 2023-09-13 MED ORDER — AMLODIPINE BESYLATE 5 MG PO TABS: ORAL_TABLET | ORAL | 3 refills | Status: DC

## 2023-09-13 NOTE — Progress Notes (Signed)
   BP 126/83   Pulse 72   Temp 97.9 F (36.6 C)   Wt 145 lb (65.8 kg)   SpO2 98%   BMI 27.40 kg/m    Subjective:    Patient ID: Karina Keller, female    DOB: [birth year], 57 y.o.   MRN: [id number]  HPI: Karina Keller is a 57 y.o. female presenting on 09/13/2023 for Hypertension   HPI  Chief Complaint  Patient presents with   Hypertension   Pt feels well today and has no complaints   Relevant past medical, surgical, family and social history reviewed and updated as indicated. Interim medical history since our last visit reviewed. Allergies and medications reviewed and updated.    Current Outpatient Medications:    amLODipine (NORVASC) 5 MG tablet, 1 po qd.  Tome una tableta por boca diaria, Disp: 30 tablet, Rfl: 3   atorvastatin (LIPITOR) 20 MG tablet, TAKE 1 Tablet BY MOUTH ONCE EVERY NIGHT AT BEDTIME.  Tome una tableta por boca al dormir, Disp: 30 tablet, Rfl: 3   omeprazole (PRILOSEC) 40 MG capsule, Take 1 capsule (40 mg total) by mouth daily as needed. Tome una capsula por boca diaria cuando sea necesario, Disp: 30 capsule, Rfl: 3    Review of Systems  Per HPI unless specifically indicated above     Objective:    BP 126/83   Pulse 72   Temp 97.9 F (36.6 C)   Wt 145 lb (65.8 kg)   SpO2 98%   BMI 27.40 kg/m   Wt Readings from Last 3 Encounters:  09/13/23 145 lb (65.8 kg)  08/02/23 143 lb 12 oz (65.2 kg)  04/26/23 146 lb 8 oz (66.5 kg)    Physical Exam Vitals reviewed.  Constitutional:      General: She is not in acute distress.    Appearance: She is well-developed. She is not toxic-appearing.  HENT:     Head: Normocephalic and atraumatic.  Cardiovascular:     Rate and Rhythm: Normal rate and regular rhythm.  Pulmonary:     Effort: Pulmonary effort is normal.     Breath sounds: Normal breath sounds.  Abdominal:     General: Bowel sounds are normal.     Palpations: Abdomen is soft. There is no mass.     Tenderness: There is no  abdominal tenderness.  Musculoskeletal:     Cervical back: Neck supple.     Right lower leg: No edema.     Left lower leg: No edema.  Lymphadenopathy:     Cervical: No cervical adenopathy.  Skin:    General: Skin is warm and dry.  Neurological:     Mental Status: She is alert and oriented to person, place, and time.  Psychiatric:        Behavior: Behavior normal.          Assessment & Plan:    Encounter Diagnoses  Name Primary?   Essential hypertension Yes   Not proficient in English language      -pt to continue current medication.  She was reminded to avoid running out -pt to follow up 3 months.  She is to contact office sooner prn

## 2023-11-28 ENCOUNTER — Other Ambulatory Visit: Payer: Self-pay | Admitting: Physician Assistant

## 2023-11-28 DIAGNOSIS — E785 Hyperlipidemia, unspecified: Secondary | ICD-10-CM

## 2023-11-28 DIAGNOSIS — I10 Essential (primary) hypertension: Secondary | ICD-10-CM

## 2023-12-12 ENCOUNTER — Ambulatory Visit: Payer: No Typology Code available for payment source | Admitting: Physician Assistant

## 2023-12-12 ENCOUNTER — Encounter: Payer: Self-pay | Admitting: Physician Assistant

## 2023-12-12 DIAGNOSIS — Z789 Other specified health status: Secondary | ICD-10-CM

## 2023-12-12 DIAGNOSIS — J069 Acute upper respiratory infection, unspecified: Secondary | ICD-10-CM

## 2023-12-12 NOTE — Patient Instructions (Addendum)
 Rest.  Drink plenty fluids.  Tylenol or ibuprofen as needed.  Descansar.  Beba muchos lquidos.  Tylenol o ibuprofeno segn sea necesario. ------------------------------- Gripe en los adultos Influenza, Adult A la gripe tambin se la conoce como influenza. Es una infeccin que afecta las vas respiratorias. Estas incluyen la nariz, la garganta, la trquea y los pulmones. La gripe es contagiosa. Esto significa que se transmite fcilmente de una persona a otra. Causa sntomas que son como un resfro. Tambin puede provocar fiebre alta y dolores corporales. Cules son las causas? La gripe es causada por el virus de la influenza. Puede contraerlo de las siguientes maneras: Al inhalar las gotitas que quedan en el aire despus de que una persona infectada tose o estornuda. Al tocar algo que est contaminado con el virus y tenet healthcare mano a la boca, la nariz o los ojos. Qu incrementa el riesgo? Puede ser ms propenso a contraer gripe si: No se lava las manos con frecuencia. Est cerca de yahoo durante la temporada de resfro y gripe. Se toca la boca, los ojos o la nariz sin antes lexmark international. No recibe la vacuna antigripal todos los aos. Tambin puede correr un mayor riesgo de tener gripe y problemas graves, como una infeccin pulmonar llamada neumona, si: Tiene ms de 65 aos. Est embarazada. Su sistema inmunitario est dbil. Su sistema inmunitario es [address] de defensa de su cuerpo. Tiene una afeccin a largo plazo, o crnica, como: Enfermedad pulmonar, cardaca o renal. Diabetes. Un trastorno del hgado. Asma. Tiene sobrepeso. Tiene anemia. Esto ocurre cuando no tiene suficientes glbulos rojos en el cuerpo. Cules son los signos o sntomas? Los sntomas de la gripe suelen aparecer de repente. Pueden durar entre 4 y [address] e incluir lo siguiente: Lajune y escalofros. Dolores de [place], dolores en el cuerpo o dolores musculares. Dolor de  advertising copywriter. Tos. Secrecin o congestin nasal. Molestias en el pecho. No querer comer tanto como lo hace normalmente. Sensacin de debilidad o cansancio. Sensacin de mareo. Nuseas o vmitos. Cmo se diagnostica? La gripe puede diagnosticarse en funcin de los sntomas y los antecedentes mdicos. Tambin pueden hacerle un examen fsico. Es posible que le hagan un hisopado de la nariz o la garganta para detectar el virus. Cmo se trata? Si la gripe se detecta de forma temprana, puede recibir tratamiento con medicamentos antivirales. Estos se pueden administrar por boca o a travs de una va intravenosa (i.v.). Pueden ayudarlo a sentirse menos enfermo y a mejorar ms rpido. Cuidarse en su hogar tambin puede ayudar a que mejoren sus sntomas. El mdico puede indicarle que: Tome medicamentos de [place]. Beba mucho lquido. La gripe suele desaparecer sola. Si tiene sntomas muy graves o problemas provocados por la gripe, es posible que necesite recibir tratamiento en un hospital. Siga estas instrucciones en su casa: Actividad Descanse todo lo que sea necesario. Duerma mucho. Doraine en su casa y no concurra al veda o a la escuela, como se lo haya indicado su mdico. Salga de su casa solo para ir al mdico. No salga de su casa por otros motivos hasta que no haya tenido fiebre por 24 horas sin tomar medicamentos. Comida y bebida Tome una solucin de rehidratacin oral (oral rehydration solution, ORS). Es ignacia bebida que se vende en farmacias y tiendas. Beba suficiente lquido como para pharmacologist la orina de color amarillo plido. Trate de beber pequeas cantidades de lquidos claros. Estos incluyen agua, cubitos de hielo, jugo de fruta rebajado con agua  y bebidas deportivas bajas en caloras. Trate de comer alimentos suaves que sean fciles de digerir. Estos incluyen bananas, compota de [place], arroz, carnes [place], tostadas y [address]. Evite las bebidas con alto contenido de  azcar o cafena. Estas incluyen las bebidas energticas, las bebidas deportivas comunes y los refrescos. No beba alcohol. No coma alimentos grasos o muy condimentados. Instrucciones generales     Use los medicamentos solamente como se lo haya indicado el mdico. Use un humidificador de aire fro para que el aire de su casa est ms hmedo. Esto puede facilitar la respiracin. Tambin debe limpiar el humidificador carmax. Para ello: Vace el agua. Vierta agua limpia. Al toser o estornudar, cbrase la boca y la [place]. Lvese las manos frecuentemente con agua y jabn y durante al menos 20 segundos. Es sumamente importante que lo haga despus de toser o engineering geologist. Si no dispone de agua y jabn, use un desinfectante para manos. Cmo se previene?  Colquese la vacuna antigripal todos los [place]. Pregntele al mdico cundo debe recibir la vacuna contra la gripe. Mantngase alejado de las personas que estn enfermas durante el otoo y el invierno. El otoo y el invierno son la temporada de los resfros y emergency planning/management officer. Comunquese con un mdico si: Tiene sntomas nuevos. Tiene dolor en el pecho. Tiene heces lquidas, tambin llamado diarrea. Tiene fiebre. La tos empeora. Empieza a tener ms mucosidad. Tiene ganas de vomitar o vomita. Solicite ayuda de inmediato si: Le falta el aire o tiene problemas para respirar. Observa que la piel o las uas estn [place]. Presenta un dolor intenso o rigidez en el cuello. Tiene un dolor de cabeza repentino o tiene dolor en la cara o el odo. Vomita cada vez que come o bebe. Estos sntomas pueden customer service manager. Llame al 911 de inmediato. No espere a ver si los sntomas desaparecen. No conduzca por sus propios medios officemax incorporated. Esta informacin no tiene theme park manager el consejo del mdico. Asegrese de hacerle al mdico cualquier pregunta que tenga. Document Revised: 02/24/2023 Document Reviewed: 12/25/2022 Elsevier Patient  Education  2024 [employer].

## 2023-12-12 NOTE — Progress Notes (Signed)
   There were no vitals taken for this visit.   Subjective:    Patient ID: Karina Keller, female    DOB: May 10, 1966, 58 y.o.   MRN: [id number]  HPI: Karina Keller is a 58 y.o. female presenting on 12/12/2023 for No chief complaint on file.   HPI    This is a telemedicine appointment through [employer].  I connected with  Karina Keller on 12/12/23 by a video enabled telemedicine application and verified that I am speaking with the correct person using two identifiers.   I discussed the limitations of evaluation and management by telemedicine. The patient expressed understanding and agreed to proceed.  Pt is at work.   Provider and translator are at office.    Pt is 57yoF who works at [employer].  She says that she felt well yesterday morning but in the afternoon became sick.  She c/o body aches, chills, subjective fever, hands and feet are cold, some cough and a little bit of phlegm.  She has a little bit of ST when she coughs but denies EA, emesis, diarrhea.   She has exposure to sick co-workers.    Relevant past medical, surgical, family and social history reviewed and updated as indicated. Interim medical history since our last visit reviewed. Allergies and medications reviewed and updated.  Review of Systems  Per HPI unless specifically indicated above     Objective:    There were no vitals taken for this visit.  Wt Readings from Last 3 Encounters:  09/13/23 145 lb (65.8 kg)  08/02/23 143 lb 12 oz (65.2 kg)  04/26/23 146 lb 8 oz (66.5 kg)    Physical Exam Constitutional:      General: She is not in acute distress.    Appearance: She is not toxic-appearing.     Comments: Pt looks like she does not feel well  HENT:     Head: Normocephalic and atraumatic.  Cardiovascular:     Heart sounds:     Friction rub: .dx.  Pulmonary:     Effort: Pulmonary effort is normal. No respiratory distress.     Comments: Pt is talking in complete sentences  without dyspnea Neurological:     Mental Status: She is alert and oriented to person, place, and time.  Psychiatric:        Behavior: Behavior normal.           Assessment & Plan:    Encounter Diagnoses  Name Primary?   Upper respiratory tract infection, unspecified type Yes   Not proficient in English language        Pt counseled on apap or ibu prn.  Rest, fluids.  She is given work note and reading instructions (her husband will pick up).  She is to contact office for new symptoms or if fails to resolve

## 2023-12-13 ENCOUNTER — Telehealth: Payer: Self-pay | Admitting: Student

## 2023-12-13 ENCOUNTER — Other Ambulatory Visit: Payer: Self-pay | Admitting: Physician Assistant

## 2023-12-13 ENCOUNTER — Ambulatory Visit: Payer: No Typology Code available for payment source | Admitting: Physician Assistant

## 2023-12-13 MED ORDER — BENZONATATE 100 MG PO CAPS: ORAL_CAPSULE | ORAL | 3 refills | Status: AC

## 2023-12-13 NOTE — Telephone Encounter (Signed)
 Pt had EV yesterday (12-13-23) for URI. Pt is calling today for worsening cough.   Pt has been treating her symptoms as recommended and has had significant improvement with her symptoms, but has worsening cough. Pt denies SOB or difficulty breathing, no wheezing.   In light of improvements no additional appt is needed at this time. Rx for tessalon  perles sent to [facility] on [address] to help treat the cough. Pt is in agreement and is aware to call the office for any new or worsening symptoms.

## 2023-12-27 ENCOUNTER — Ambulatory Visit: Payer: No Typology Code available for payment source | Admitting: Physician Assistant

## 2024-01-10 ENCOUNTER — Ambulatory Visit: Payer: No Typology Code available for payment source | Admitting: Physician Assistant

## 2024-01-10 ENCOUNTER — Encounter: Payer: Self-pay | Admitting: Physician Assistant

## 2024-01-10 VITALS — BP 137/86 | HR 73 | Temp 97.6°F

## 2024-01-10 DIAGNOSIS — Z789 Other specified health status: Secondary | ICD-10-CM

## 2024-01-10 DIAGNOSIS — M6740 Ganglion, unspecified site: Secondary | ICD-10-CM

## 2024-01-10 DIAGNOSIS — E785 Hyperlipidemia, unspecified: Secondary | ICD-10-CM

## 2024-01-10 DIAGNOSIS — I10 Essential (primary) hypertension: Secondary | ICD-10-CM

## 2024-01-10 MED ORDER — ATORVASTATIN CALCIUM 20 MG PO TABS: ORAL_TABLET | ORAL | 3 refills | Status: DC

## 2024-01-10 MED ORDER — AMLODIPINE BESYLATE 5 MG PO TABS: ORAL_TABLET | ORAL | 3 refills | Status: DC

## 2024-01-10 NOTE — Progress Notes (Signed)
BP 137/86   Pulse 73   Temp 97.6 F (36.4 C)   SpO2 98%    Subjective:    Patient ID: Karina Keller, female    DOB: 09-20-1966, 58 y.o.   MRN: [id number]  HPI: Karina Keller is a 58 y.o. female presenting on 01/10/2024 for Hypertension and Hyperlipidemia   HPI   Chief Complaint  Patient presents with   Hypertension   Hyperlipidemia    Out of meds x 2 days.  Pt didn't get labs drawn yet due to costs.    Pt says she has a bump on right hand that hurts at times.  She works at [employer].     Relevant past medical, surgical, family and social history reviewed and updated as indicated. Interim medical history since our last visit reviewed. Allergies and medications reviewed and updated.   Current Outpatient Medications:    amLODipine (NORVASC) 5 MG tablet, 1 po qd.  Tome una tableta por boca diaria (Patient not taking: Reported on 01/10/2024), Disp: 30 tablet, Rfl: 3   atorvastatin (LIPITOR) 20 MG tablet, TAKE 1 Tablet BY MOUTH ONCE EVERY NIGHT AT BEDTIME.  Tome una tableta por boca al dormir (Patient not taking: Reported on 01/10/2024), Disp: 30 tablet, Rfl: 3   benzonatate (TESSALON PERLES) 100 MG capsule, 1-2 po q 8 hour prn cough. Tome 1-2 capsulas por boca cada 8 horas cuando sea necesario para la tos, Disp: 20 capsule, Rfl: 3   omeprazole (PRILOSEC) 40 MG capsule, Take 1 capsule (40 mg total) by mouth daily as needed. Tome una capsula por boca diaria cuando sea necesario (Patient not taking: Reported on 01/10/2024), Disp: 30 capsule, Rfl: 3    Review of Systems  Per HPI unless specifically indicated above     Objective:    BP 137/86   Pulse 73   Temp 97.6 F (36.4 C)   SpO2 98%   Wt Readings from Last 3 Encounters:  09/13/23 145 lb (65.8 kg)  08/02/23 143 lb 12 oz (65.2 kg)  04/26/23 146 lb 8 oz (66.5 kg)    Physical Exam Vitals reviewed.  Constitutional:      General: She is not in acute distress.    Appearance: She is  well-developed. She is not toxic-appearing.  HENT:     Head: Normocephalic and atraumatic.  Cardiovascular:     Rate and Rhythm: Normal rate and regular rhythm.  Pulmonary:     Effort: Pulmonary effort is normal.     Breath sounds: Normal breath sounds.  Abdominal:     General: Bowel sounds are normal.     Palpations: Abdomen is soft. There is no mass.     Tenderness: There is no abdominal tenderness.  Musculoskeletal:     Cervical back: Neck supple.     Right lower leg: No edema.     Left lower leg: No edema.     Comments: Soft mobile cyst at Proximal end of Right first metacarpal/distal end radius.  Lymphadenopathy:     Cervical: No cervical adenopathy.  Skin:    General: Skin is warm and dry.  Neurological:     Mental Status: She is alert and oriented to person, place, and time.  Psychiatric:        Behavior: Behavior normal.            Assessment & Plan:   Encounter Diagnoses  Name Primary?   Essential hypertension Yes   Hyperlipidemia, unspecified hyperlipidemia type  Ganglion cyst    Not proficient in English language      Will Check on [facility]-financial assistance, [facility] financial assistance and medassist  Pt Needs to Get labs drawn Pt Needs referral to orthopedics  [facility]  Pt to follow up 3 months.  She is to contact office sooner prn  F/u here 3 months.

## 2024-01-11 ENCOUNTER — Telehealth: Payer: Self-pay | Admitting: Student

## 2024-01-11 NOTE — Telephone Encounter (Signed)
LPN spoke with patient and informed her that she automatically receives a discount through [facility] due to being uninsured, but could qualify for some additional discount if she applies and is approved for CAFA.   Pt was also advised to apply for [place] [facility] for help with medication costs. Pt was oriented to contact [facility] for assistance in applying for CAFA and [place] [facility]. Pt was provided with [employer]'s contact information. Pt verbalized understanding and states she will contact [employer] to get these applications started.  Pt would also like to hold off on being referred to ortho for her thumb until she is able to arrange her financial situation. Pt is to contact this office when she is ready for the referral. Pt verbalized understanding.

## 2024-03-29 ENCOUNTER — Other Ambulatory Visit: Payer: Self-pay | Admitting: Physician Assistant

## 2024-03-29 DIAGNOSIS — Z131 Encounter for screening for diabetes mellitus: Secondary | ICD-10-CM

## 2024-03-29 DIAGNOSIS — E876 Hypokalemia: Secondary | ICD-10-CM

## 2024-03-29 DIAGNOSIS — R7309 Other abnormal glucose: Secondary | ICD-10-CM

## 2024-03-29 DIAGNOSIS — I10 Essential (primary) hypertension: Secondary | ICD-10-CM

## 2024-03-29 DIAGNOSIS — E785 Hyperlipidemia, unspecified: Secondary | ICD-10-CM

## 2024-04-10 ENCOUNTER — Ambulatory Visit: Payer: No Typology Code available for payment source | Admitting: Physician Assistant

## 2024-04-18 ENCOUNTER — Other Ambulatory Visit: Payer: Self-pay | Admitting: Physician Assistant

## 2024-04-18 MED ORDER — ATORVASTATIN CALCIUM 20 MG PO TABS: ORAL_TABLET | ORAL | 0 refills | Status: DC

## 2024-04-18 MED ORDER — AMLODIPINE BESYLATE 5 MG PO TABS: ORAL_TABLET | ORAL | 0 refills | Status: DC

## 2024-04-24 ENCOUNTER — Ambulatory Visit: Admitting: Physician Assistant

## 2024-05-08 ENCOUNTER — Encounter: Payer: Self-pay | Admitting: Physician Assistant

## 2024-05-08 ENCOUNTER — Other Ambulatory Visit (HOSPITAL_COMMUNITY)
Admission: RE | Admit: 2024-05-08 | Discharge: 2024-05-08 | Disposition: A | Discharge status: Home / Self Care | Payer: Self-pay | Source: Ambulatory Visit | Attending: Physician Assistant | Admitting: Physician Assistant

## 2024-05-08 ENCOUNTER — Ambulatory Visit: Admitting: Physician Assistant

## 2024-05-08 VITALS — BP 114/73 | HR 81 | Temp 97.9°F | Ht 61.0 in | Wt 147.8 lb

## 2024-05-08 DIAGNOSIS — L989 Disorder of the skin and subcutaneous tissue, unspecified: Secondary | ICD-10-CM

## 2024-05-08 DIAGNOSIS — I10 Essential (primary) hypertension: Secondary | ICD-10-CM

## 2024-05-08 DIAGNOSIS — Z789 Other specified health status: Secondary | ICD-10-CM

## 2024-05-08 DIAGNOSIS — E876 Hypokalemia: Secondary | ICD-10-CM | POA: Insufficient documentation

## 2024-05-08 DIAGNOSIS — Z1211 Encounter for screening for malignant neoplasm of colon: Secondary | ICD-10-CM

## 2024-05-08 DIAGNOSIS — R7309 Other abnormal glucose: Secondary | ICD-10-CM | POA: Insufficient documentation

## 2024-05-08 DIAGNOSIS — E785 Hyperlipidemia, unspecified: Secondary | ICD-10-CM | POA: Insufficient documentation

## 2024-05-08 DIAGNOSIS — M7702 Medial epicondylitis, left elbow: Secondary | ICD-10-CM

## 2024-05-08 DIAGNOSIS — Z131 Encounter for screening for diabetes mellitus: Secondary | ICD-10-CM | POA: Insufficient documentation

## 2024-05-08 LAB — COMPREHENSIVE METABOLIC PANEL WITH GFR
ALT: 34 U/L (ref 0–44)
AST: 33 U/L (ref 15–41)
Albumin: 3.8 g/dL (ref 3.5–5.0)
Alkaline Phosphatase: 126 U/L (ref 38–126)
Anion gap: 8 (ref 5–15)
BUN: 8 mg/dL (ref 6–20)
CO2: 25 mmol/L (ref 22–32)
Calcium: 8.9 mg/dL (ref 8.9–10.3)
Chloride: 105 mmol/L (ref 98–111)
Creatinine, Ser: 0.49 mg/dL (ref 0.44–1.00)
GFR, Estimated: >60 mL/min (ref >60)
Glucose, Bld: 96 mg/dL (ref 70–99)
Potassium: 3.3 mmol/L — ABNORMAL LOW (ref 3.5–5.1)
Sodium: 138 mmol/L (ref 135–145)
Total Bilirubin: 1.1 mg/dL (ref 0.0–1.2)
Total Protein: 7.2 g/dL (ref 6.5–8.1)

## 2024-05-08 LAB — LIPID PANEL
Cholesterol: 135 mg/dL (ref 0–200)
HDL: 55 mg/dL (ref >40)
LDL Cholesterol: 67 mg/dL (ref 0–99)
Total CHOL/HDL Ratio: 2.5 ratio
Triglycerides: 63 mg/dL (ref <150)
VLDL: 13 mg/dL (ref 0–40)

## 2024-05-08 LAB — HEMOGLOBIN A1C
Hgb A1c MFr Bld: 5.8 % — ABNORMAL HIGH (ref 4.8–5.6)
Mean Plasma Glucose: 120 mg/dL

## 2024-05-08 MED ORDER — AMLODIPINE BESYLATE 5 MG PO TABS: ORAL_TABLET | ORAL | 0 refills | Status: DC

## 2024-05-08 MED ORDER — ATORVASTATIN CALCIUM 20 MG PO TABS: ORAL_TABLET | ORAL | 0 refills | Status: DC

## 2024-05-08 NOTE — Progress Notes (Signed)
 BP 114/73   Pulse 81   Temp 97.9 F (36.6 C)   Ht 5\' 1"  (1.549 m)   Wt 147 lb 12 oz (67 kg)   SpO2 97%   BMI 27.92 kg/m    Subjective:    Patient ID: Karina Keller, female    DOB: [birth year], 58 y.o.   MRN: [id number]  HPI: Karina Keller is a 58 y.o. female presenting on 05/08/2024 for Hypertension, Hyperlipidemia, Skin Problem (Pt has had a spot on her nose for about a year and a half. Pt states it itches some, but has noticed no changes since it first appeared.), and Arm Pain (Pt c/o swelling and pain on medial aspect of her arm joint. Pt states she believes it is from work.)   HPI    Chief Complaint  Patient presents with   Hypertension   Hyperlipidemia   Skin Problem    Pt has had a spot on her nose for about a year and a half. Pt states it itches some, but has noticed no changes since it first appeared.   Arm Pain    Pt c/o swelling and pain on medial aspect of her arm joint. Pt states she believes it is from work.    Pt says her arm hurts mostly at work.  She works at [employer].  She denies acute injury.   Relevant past medical, surgical, family and social history reviewed and updated as indicated. Interim medical history since our last visit reviewed. Allergies and medications reviewed and updated.   Current Outpatient Medications:    amLODipine  (NORVASC ) 5 MG tablet, 1 po qd.  Tome una tableta por boca diaria, Disp: 30 tablet, Rfl: 0   atorvastatin  (LIPITOR) 20 MG tablet, TAKE 1 Tablet BY MOUTH ONCE EVERY NIGHT AT BEDTIME.  Tome una tableta por boca al dormir, Disp: 30 tablet, Rfl: 0   benzonatate  (TESSALON  PERLES) 100 MG capsule, 1-2 po q 8 hour prn cough. Tome 1-2 capsulas por boca cada 8 horas cuando sea necesario para la tos, Disp: 20 capsule, Rfl: 3   omeprazole  (PRILOSEC) 40 MG capsule, Take 1 capsule (40 mg total) by mouth daily as needed. Tome una capsula por boca diaria cuando sea necesario (Patient not taking: Reported on  01/10/2024), Disp: 30 capsule, Rfl: 3    Review of Systems  Per HPI unless specifically indicated above     Objective:     BP 114/73   Pulse 81   Temp 97.9 F (36.6 C)   Ht 5\' 1"  (1.549 m)   Wt 147 lb 12 oz (67 kg)   SpO2 97%   BMI 27.92 kg/m   Wt Readings from Last 3 Encounters:  05/08/24 147 lb 12 oz (67 kg)  09/13/23 145 lb (65.8 kg)  08/02/23 143 lb 12 oz (65.2 kg)    Physical Exam Vitals reviewed.  Constitutional:      General: She is not in acute distress.    Appearance: She is well-developed. She is not ill-appearing or toxic-appearing.  HENT:     Head: Normocephalic and atraumatic.  Cardiovascular:     Rate and Rhythm: Normal rate and regular rhythm.  Pulmonary:     Effort: Pulmonary effort is normal.     Breath sounds: Normal breath sounds.  Abdominal:     General: Bowel sounds are normal.     Palpations: Abdomen is soft. There is no mass.     Tenderness: There is no abdominal tenderness.  Musculoskeletal:  Left elbow: No swelling or deformity. Normal range of motion. Tenderness present in medial epicondyle. No lateral epicondyle tenderness.     Cervical back: Neck supple.  Lymphadenopathy:     Cervical: No cervical adenopathy.  Skin:    General: Skin is warm and dry.     Comments: Lesion on nose as pictured  Neurological:     Mental Status: She is alert and oriented to person, place, and time.  Psychiatric:        Behavior: Behavior normal.            Assessment & Plan:   Encounter Diagnoses  Name Primary?   Essential hypertension Yes   Hyperlipidemia, unspecified hyperlipidemia type    Medial epicondylitis of left elbow    Skin lesion of face    Screening for colon cancer    Not proficient in English language      Htn -pt to continue amlodipine  -she is to Get labs drawn. She will be called with results  Dyslipidemia -continue atorvastatin . Update labs  Medial epicondylitis -pt counseled about diagnosis and was given  reading information -she is encouraged to ice the elbow after work for 15-20 minutes -she can use IBU or naproxen as well if needed  Skin lesion on nose -discussed with pt  -will Refer to derm when able (she prefers on Tuesday in the morning if possible)  HCM -pt was given FIT test for colon cancer screening  Uninsured -pt encouraged to update enrollment which expired in April.  Discussed that we are unable to refer her because she is not officially a pt of [facility] until her enrollment is updated.  She says she will get this done

## 2024-05-08 NOTE — Patient Instructions (Signed)
 Codo de golfista: qu debe saber Golfer's Elbow: What to Know  La causa del codo de [place], o epicondilitis medial, es la hinchazn o dao en algunos tendones del codo. Los tendones son los tejidos fuertes que Fiserv a los [place]. El codo de [place] afecta a los tendones que se Best boy con los msculos que ayudan a flexionar la palma hacia la [place]. Estos tendones se vuelven menos flexibles a medida que uno envejece. El codo de golfista es ms frecuente en las personas que flexionan y tuercen las muecas muchas veces, como los [place]. Cules son las causas? Causas del codo del golfista: Flexionar, Set designer o torcer la [employer] y [place]. A menudo, agarrar objetos con las manos. Lesin repentina. Qu incrementa el riesgo? Es ms probable que tenga codo del golfista si: Practica determinados deportes, por ejemplo: Golf. Bisbol. Tenis. Tiene que usar [employer] manos en su trabajo. Esto incluye si usa  computadora en el trabajo o si es: [place]. Carnicero. Msico. Cules son los signos o sntomas? Algunos de los sntomas son los siguientes: Dolor en: El codo. El antebrazo. Bonnielee Buttery. Un agarre dbil en la mano. El Research officer, trade union al flexionar la [place]. Cmo se diagnostica? El codo de [place] se diagnostica en funcin de sus sntomas, antecedentes mdicos y un examen fsico. Durante el examen, el mdico puede: Evaluar la fuerza para Landscape architect. Mover la [place] para verificar si le duele. Tambin es posible que deba someterse a una Health visitor (RM) para: Averiguar si tiene codo de [place]. Buscar otros problemas. Detectar desgarros en los msculos o los tendones. Cmo se trata? Es posible que deba hacer lo siguiente: Dejar de hacer cosas que lo hagan doblar o torcer el codo o la [place]. Esperar hasta que el dolor haya desaparecido antes de volver a hacer esas cosas. Usar un dispositivo ortopdico para el codo o una frula para  la [place]. Aplicarse hielo en la parte interna del codo, el antebrazo o la [place]. Tomar antiinflamatorios no esteroideos (AINE), como ibuprofeno, o ponerse inyecciones de corticoesteroides. Estos ayudan con la hinchazn y Chief Technology Officer. Hacer fisioterapia como se lo haya indicado el mdico. En casos poco frecuentes, es posible que necesite una ciruga si no mejora. Siga estas instrucciones en su casa: Si tiene un dispositivo ortopdico o una frula: Use el dispositivo ortopdico o la frula como se lo hayan indicado. Karlene Overcast solo si el mdico lo [place]. Controle [place] piel a su alrededor. Informe al mdico si observa problemas. Afloje el dispositivo ortopdico o la frula si los dedos se le entumecen, siente hormigueo o se le enfran y se tornan de Research officer, trade union. Mantenga el dispositivo ortopdico y la frula limpios. Si el dispositivo ortopdico o la frula no son impermeables: No deje que se mojen. Cbralos para ducharse o baarse. Use una cubierta que no permita que [place]. Control del dolor, la rigidez y la hinchazn  Use hielo o una bolsa de hielo como se lo hayan indicado. Si tiene una frula o un dispositivo ortopdico que se pueden sacar, quteselos como se lo hayan indicado. Ponga una toalla entre la piel y el hielo. Aplique el hielo durante 20 minutos, 2 a 3 veces por da. Si la piel se le pone de color rojo, quite el hielo de inmediato para evitar daos en la piel. El [place] de dao es mayor si no puede sentir dolor, Airline pilot o fro. Mueva los dedos de la mano con frecuencia para Transport planner rigidez  y la hinchazn. Levante el brazo por encima del nivel del corazn cuando est sentado o acostado. Use almohadas segn sea necesario. Actividad Descanse el brazo como se lo hayan indicado. Haga ejercicio segn las indicaciones. Pregunte qu actividades de la casa son seguras para que haga. Pregunte cundo puede volver al trabajo o a la escuela. Estilo de vida Si tiene codo de  golfista por Microbiologist, trabaje con Scientist, forensic. Este puede asegurarse de que usted: Use la Careers information officer. Utilice el equipo adecuado. Si adquiri codo de golfista por trabajar, hable con su trabajo sobre cmo controlar la afeccin. Instrucciones generales Use los medicamentos nicamente segn las indicaciones. No fume, vapee ni consuma nicotina o tabaco. Cmo se previene? Precaliente y elongue adecuadamente antes de hacer actividad fsica. Reljese y elongue despus de hacer actividad fsica. Dele a su cuerpo tiempo para descansar. Mantngase en forma. Mantenga su cuerpo fuerte y flexible. Use equipo deportivo que sea apto para usted. Si juega al golf, ralentice el swing. Esto puede ayudar a Administrator, arts brazo al hacer contacto con la pelota. Comunquese con un mdico si: El dolor no mejora. El dolor [place]. Se le adormece la mano. Solicite ayuda de inmediato si: Nurse, adult. No puede mover la [place]. Esta informacin no tiene Theme park manager el consejo del mdico. Asegrese de hacerle al mdico cualquier pregunta que tenga. Document Revised: 08/12/2023 Document Reviewed: 08/12/2023 Elsevier Patient Education  2024 [employer].

## 2024-05-09 ENCOUNTER — Ambulatory Visit: Payer: Self-pay | Admitting: Physician Assistant

## 2024-07-23 ENCOUNTER — Other Ambulatory Visit: Payer: Self-pay | Admitting: Physician Assistant

## 2024-07-23 DIAGNOSIS — I10 Essential (primary) hypertension: Secondary | ICD-10-CM

## 2024-07-23 DIAGNOSIS — E785 Hyperlipidemia, unspecified: Secondary | ICD-10-CM

## 2024-07-24 ENCOUNTER — Other Ambulatory Visit: Payer: Self-pay | Admitting: Physician Assistant

## 2024-08-07 ENCOUNTER — Ambulatory Visit: Admitting: Physician Assistant

## 2024-08-14 ENCOUNTER — Ambulatory Visit: Admitting: Physician Assistant

## 2024-08-15 ENCOUNTER — Other Ambulatory Visit (HOSPITAL_COMMUNITY)
Admission: RE | Admit: 2024-08-15 | Discharge: 2024-08-15 | Disposition: A | Discharge status: Home / Self Care | Payer: Self-pay | Source: Ambulatory Visit | Attending: Physician Assistant | Admitting: Physician Assistant

## 2024-08-15 DIAGNOSIS — E785 Hyperlipidemia, unspecified: Secondary | ICD-10-CM | POA: Insufficient documentation

## 2024-08-15 DIAGNOSIS — I10 Essential (primary) hypertension: Secondary | ICD-10-CM | POA: Insufficient documentation

## 2024-08-15 LAB — COMPREHENSIVE METABOLIC PANEL WITH GFR
ALT: 35 U/L (ref 0–44)
AST: 34 U/L (ref 15–41)
Albumin: 3.8 g/dL (ref 3.5–5.0)
Alkaline Phosphatase: 133 U/L — ABNORMAL HIGH (ref 38–126)
Anion gap: 12 (ref 5–15)
BUN: 10 mg/dL (ref 6–20)
CO2: 25 mmol/L (ref 22–32)
Calcium: 8.8 mg/dL — ABNORMAL LOW (ref 8.9–10.3)
Chloride: 104 mmol/L (ref 98–111)
Creatinine, Ser: 0.54 mg/dL (ref 0.44–1.00)
GFR, Estimated: >60 mL/min (ref >60)
Glucose, Bld: 100 mg/dL — ABNORMAL HIGH (ref 70–99)
Potassium: 3.4 mmol/L — ABNORMAL LOW (ref 3.5–5.1)
Sodium: 141 mmol/L (ref 135–145)
Total Bilirubin: 0.8 mg/dL (ref 0.0–1.2)
Total Protein: 7.3 g/dL (ref 6.5–8.1)

## 2024-08-15 LAB — LIPID PANEL
Cholesterol: 137 mg/dL (ref 0–200)
HDL: 56 mg/dL (ref >40)
LDL Cholesterol: 70 mg/dL (ref 0–99)
Total CHOL/HDL Ratio: 2.4 ratio
Triglycerides: 56 mg/dL (ref <150)
VLDL: 11 mg/dL (ref 0–40)

## 2024-08-21 ENCOUNTER — Ambulatory Visit: Payer: Self-pay | Admitting: Physician Assistant

## 2024-08-21 ENCOUNTER — Encounter: Payer: Self-pay | Admitting: Physician Assistant

## 2024-08-21 ENCOUNTER — Ambulatory Visit: Admitting: Physician Assistant

## 2024-08-21 VITALS — BP 130/90 | HR 73 | Temp 98.0°F | Ht 61.0 in | Wt 148.0 lb

## 2024-08-21 DIAGNOSIS — Z1211 Encounter for screening for malignant neoplasm of colon: Secondary | ICD-10-CM

## 2024-08-21 DIAGNOSIS — E785 Hyperlipidemia, unspecified: Secondary | ICD-10-CM

## 2024-08-21 DIAGNOSIS — Z1239 Encounter for other screening for malignant neoplasm of breast: Secondary | ICD-10-CM

## 2024-08-21 DIAGNOSIS — Z789 Other specified health status: Secondary | ICD-10-CM

## 2024-08-21 DIAGNOSIS — R079 Chest pain, unspecified: Secondary | ICD-10-CM

## 2024-08-21 DIAGNOSIS — I10 Essential (primary) hypertension: Secondary | ICD-10-CM

## 2024-08-21 MED ORDER — OMEPRAZOLE 40 MG PO CPDR: 40.0000 mg | DELAYED_RELEASE_CAPSULE | Freq: Every day | ORAL | 3 refills | Status: AC | PRN

## 2024-08-21 MED ORDER — ATORVASTATIN CALCIUM 20 MG PO TABS: 20.0000 mg | ORAL_TABLET | Freq: Every day | ORAL | 6 refills | Status: DC

## 2024-08-21 MED ORDER — AMLODIPINE BESYLATE 5 MG PO TABS: 5.0000 mg | ORAL_TABLET | Freq: Every day | ORAL | 6 refills | Status: AC

## 2024-08-21 NOTE — Progress Notes (Signed)
 BP (!) 130/90   Pulse 73   Temp 98 F (36.7 C)   Ht 5' 1 (1.549 m)   Wt 148 lb (67.1 kg)   SpO2 98%   BMI 27.96 kg/m    Subjective:    Patient ID: Karina Keller, female    DOB: 08-Jun-1966, 58 y.o.   MRN: [id number]  HPI: Karina Keller is a 58 y.o. female presenting on 08/21/2024 for Hypertension and Hyperlipidemia   HPI  Chief Complaint  Patient presents with   Hypertension   Hyperlipidemia    Pt reports Occassional chest heaviness.  Happens when she is working.  No assoc sob. Feels palpitations.  Happens maybe once/month She only Feels reflux when she eats a chili pepper   Pt c/o CP 02/17/21 and November 2020.  She was seen by cardiology and had lexiscan  myovue in 11/2019 which was normal.    Relevant past medical, surgical, family and social history reviewed and updated as indicated. Interim medical history since our last visit reviewed. Allergies and medications reviewed and updated.   Current Outpatient Medications:    amLODipine  (NORVASC ) 5 MG tablet, Take 1 tablet by mouth once daily, Disp: 30 tablet, Rfl: 3   atorvastatin  (LIPITOR) 20 MG tablet, TAKE 1 TABLET BY MOUTH ONCE DAILY AT BEDTIME, Disp: 30 tablet, Rfl: 0   benzonatate  (TESSALON  PERLES) 100 MG capsule, 1-2 po q 8 hour prn cough. Tome 1-2 capsulas por boca cada 8 horas cuando sea necesario para la tos (Patient not taking: Reported on 08/21/2024), Disp: 20 capsule, Rfl: 3   omeprazole  (PRILOSEC) 40 MG capsule, Take 1 capsule (40 mg total) by mouth daily as needed. Tome una capsula por boca diaria cuando sea necesario (Patient not taking: Reported on 08/21/2024), Disp: 30 capsule, Rfl: 3    Review of Systems  Per HPI unless specifically indicated above     Objective:    BP (!) 130/90   Pulse 73   Temp 98 F (36.7 C)   Ht 5' 1 (1.549 m)   Wt 148 lb (67.1 kg)   SpO2 98%   BMI 27.96 kg/m   Wt Readings from Last 3 Encounters:  08/21/24 148 lb (67.1 kg)  05/08/24 147 lb 12 oz (67 kg)   09/13/23 145 lb (65.8 kg)    Physical Exam Vitals reviewed.  Constitutional:      General: She is not in acute distress.    Appearance: She is well-developed. She is not toxic-appearing.  HENT:     Head: Normocephalic and atraumatic.  Cardiovascular:     Rate and Rhythm: Normal rate and regular rhythm.  Pulmonary:     Effort: Pulmonary effort is normal.     Breath sounds: Normal breath sounds.  Abdominal:     General: Bowel sounds are normal.     Palpations: Abdomen is soft. There is no mass.     Tenderness: There is no abdominal tenderness.  Musculoskeletal:     Cervical back: Neck supple.     Right lower leg: No edema.     Left lower leg: No edema.  Lymphadenopathy:     Cervical: No cervical adenopathy.  Skin:    General: Skin is warm and dry.  Neurological:     Mental Status: She is alert and oriented to person, place, and time.  Psychiatric:        Behavior: Behavior normal.     Results for orders placed or performed during the hospital encounter of 08/15/24  Comprehensive  metabolic panel with GFR   Collection Time: 08/15/24  8:23 AM  Result Value Ref Range   Sodium 141 135 - 145 mmol/L   Potassium 3.4 (L) 3.5 - 5.1 mmol/L   Chloride 104 98 - 111 mmol/L   CO2 25 22 - 32 mmol/L   Glucose, Bld 100 (H) 70 - 99 mg/dL   BUN 10 6 - 20 mg/dL   Creatinine, Ser 9.45 0.44 - 1.00 mg/dL   Calcium  8.8 (L) 8.9 - 10.3 mg/dL   Total Protein 7.3 6.5 - 8.1 g/dL   Albumin 3.8 3.5 - 5.0 g/dL   AST 34 15 - 41 U/L   ALT 35 0 - 44 U/L   Alkaline Phosphatase 133 (H) 38 - 126 U/L   Total Bilirubin 0.8 0.0 - 1.2 mg/dL   GFR, Estimated >39 >39 mL/min   Anion gap 12 5 - 15  Lipid panel   Collection Time: 08/15/24  8:23 AM  Result Value Ref Range   Cholesterol 137 0 - 200 mg/dL   Triglycerides 56 <849 mg/dL   HDL 56 >59 mg/dL   Total CHOL/HDL Ratio 2.4 RATIO   VLDL 11 0 - 40 mg/dL   LDL Cholesterol 70 0 - 99 mg/dL   EKG-  SR at 63 bpm with nonspecific st-t changes.  No changes  from EKG 02/17/21      Assessment & Plan:    Encounter Diagnoses  Name Primary?   Essential hypertension Yes   Hyperlipidemia, unspecified hyperlipidemia type    Encounter for screening for malignant neoplasm of breast, unspecified screening modality    Screening for colon cancer    Chest pain, unspecified type    Not proficient in [place] language      -Reviewed labs with pt -pt to Continue current medications -pt was Counseled to use omeprazole  prn -pt was given FIT test for colon cancer screening -She already got flu shot for this season -Mammogram-will send scholarship unknown if she will qualify -refer to cardiology for further evaluation and recommendations  -F/u four months

## 2024-09-14 ENCOUNTER — Telehealth: Payer: Self-pay

## 2024-09-14 NOTE — Telephone Encounter (Signed)
 Telephoned patient at mobile number using interpreter#[phone]. Left a voice message with [facility] (scholarship) contact information.

## 2024-10-09 ENCOUNTER — Encounter: Payer: Self-pay | Admitting: Physician Assistant

## 2024-10-09 ENCOUNTER — Ambulatory Visit: Admitting: Physician Assistant

## 2024-10-09 VITALS — BP 120/84 | HR 76 | Temp 98.0°F | Ht 61.0 in | Wt 145.5 lb

## 2024-10-09 DIAGNOSIS — N950 Postmenopausal bleeding: Secondary | ICD-10-CM

## 2024-10-09 DIAGNOSIS — Z789 Other specified health status: Secondary | ICD-10-CM

## 2024-10-09 DIAGNOSIS — J069 Acute upper respiratory infection, unspecified: Secondary | ICD-10-CM

## 2024-10-09 NOTE — Patient Instructions (Signed)
 Infeccin de las vas respiratorias superiores en adultos Upper Respiratory Infection, Adult Una infeccin de las vas respiratorias superiores (IVRS) es una infeccin viral comn de la [place], garganta y de las vas respiratorias superiores que conducen el aire a los pulmones. El tipo ms comn de IVRS es el resfro comn. Las IVRS generalmente mejoran solas, sin tratamiento mdico. Cules son las causas? La causa es un virus. Se puede contagiar este virus: Al aspirar las gotitas que una persona infectada elimina al toser o Engineering geologist. Tocar algo que estuvo expuesto al virus (est contaminado) y luego tocarse la boca, la nariz o los ojos. Qu incrementa el riesgo? Es ms propenso a Health and safety inspector IVRS si: Es muy pequeo o de edad muy [place]. Tiene contacto cercano con otros, como en el [place], la escuela o un centro de atencin mdica. Fuma. Tiene una enfermedad cardaca o pulmonar a largo plazo (crnica). Tiene debilitado el sistema encargado de combatir las enfermedades (sistema inmunitario). Tiene asma o alergias nasales. Est sufriendo mucho estrs. Tiene un dficit nutricional. Cules son los signos o sntomas? La IVRS suele presentar alguno de los siguientes sntomas: Secrecin nasal o nariz tapada (congestin). Tos. Estornudos. Dolor de Advertising copywriter. Dolor de [place]. Fatiga. Grant Ruts. Prdida del apetito. Dolor en la frente, detrs de los ojos y por encima de los pmulos (dolor sinusal). Dolores musculares. Enrojecimiento o irritacin de los ojos. Presin en los odos o la cara. Cmo se diagnostica? Esta afeccin se puede diagnosticar en funcin de los antecedentes mdicos, los sntomas y un examen fsico. El mdico puede usar un hisopo para tomar una muestra de mucosidad de la nariz (hisopado nasal). Esta muestra puede analizarse para determinar qu virus est provocando la enfermedad. Cmo se trata? Las IVRS generalmente mejoran por s solas en un perodo de entre 7 y [address] Los medicamentos no curan las IVRS, Biomedical engineer el mdico puede recomendarle ciertos medicamentos para ayudar a [employer], como por ejemplo: Medicamentos para la tos de [place]. Antitusivos. Toser es un tipo de defensa contra una infeccin que ayuda a limpiar el sistema respiratorio, de modo que tome estos medicamentos solo segn se lo recomiende el mdico. Medicamentos para bajar la fiebre. Siga estas instrucciones en su casa: Actividad Descanse todo lo que sea necesario. Si tiene fiebre, qudese en su casa, es decir, no vaya al trabajo o la escuela, hasta que no tenga fiebre o hasta que el mdico le diga que la IVRS ya no se puede diseminar a Economist (ya no [place]). El mdico puede pedirle que use una mscara facial para evitar que disemine la infeccin. Para aliviar los sntomas Haga grgaras con una mezcla de agua y sal 3 o 4 veces al da, o segn sea necesario. Para preparar agua con sal, disuelva totalmente de  a 1 cucharadita (de 3 a 6 g) de sal en 1 taza (237 ml) de agua tibia. Use un humidificador de aire fro para agregar humedad al aire. Esto puede ayudarlo a que respire mejor. Comida y bebida  Beba suficiente lquido como para Pharmacologist la orina de color amarillo plido. Tome sopas y caldos transparentes. Instrucciones generales  Use los medicamentos de venta libre y los recetados solamente como se lo haya indicado el mdico. Estos incluyen medicamentos para el resfro, para bajar la fiebre y antitusivos. No consuma ningn producto que contenga nicotina o tabaco. Estos productos incluyen cigarrillos, tabaco para Theatre manager y aparatos de vapeo, como los Administrator, Civil Service. Si necesita ayuda para dejar de fumar, consulte al  mdico. Aljese del humo de otros fumadores. Mantngase al da con todas las vacunas, incluso la vacuna anual (una vez al ao) contra la gripe. Concurra a todas las visitas de seguimiento. Esto es importante. Cmo evitar contagiar la  infeccin a otros Las IVRS pueden ser [employer]. Para evitar que la infeccin se propague, tome las siguientes medidas: Lvese las manos con agua y jabn durante al menos 20 segundos. Use desinfectante para manos si no dispone de [place] y [place]. Evite tocarse la boca, la cara, los ojos o la [place]. Tosa o estornude en un pauelo de papel o en su manga o codo en lugar de hacerlo en la mano o en el aire.  Comunquese con un mdico si: Empeora en lugar de mejorar. Tiene fiebre o escalofros. Tiene mucosidad marrn o roja. Tiene una secrecin amarilla o marrn de la Clinical cytogeneticist. Le duele la cara, especialmente al inclinarse hacia adelante. Tiene los ganglios del cuello hinchados. Siente dolor al tragar. Tiene zonas blancas en la parte de atrs de la garganta. Solicite ayuda de inmediato si: La falta de aire empeora. Tiene sntomas intensos o persistentes de: Dolor de [place]. Dolor de odo. Dolor sinusal. Dolor de pecho. Tiene enfermedad pulmonar crnica junto con cualquiera de estos sntomas: Emitir sonidos de silbidos agudos al respirar, ms a menudo al exhalar (sibilancias). Tos prolongada (ms de [address] Tos con sangre. Cambio en la mucosidad habitual. Tiene rigidez en el cuello. Tiene cambios en: La visin. La audicin. El razonamiento. El [place] de nimo. Estos sntomas pueden Customer service manager. Solicite ayuda de inmediato. Llame al 911. No espere a ver si los sntomas desaparecen. No conduzca por sus propios medios [employer]. Resumen Una infeccin de las vas respiratorias superiores (IVRS) es una infeccin comn de la nariz, la garganta y las vas respiratorias superiores que conducen el aire a los pulmones. La causa es un virus. Las IVRS generalmente mejoran por s solas en un perodo de entre 7 y [address]. Los medicamentos no curan las IVRS, pero el mdico puede recomendarle ciertos medicamentos para ayudar a [employer]. Esta informacin no tiene Public house manager el consejo del mdico. Asegrese de hacerle al mdico cualquier pregunta que tenga. Document Revised: 07/13/2021 Document Reviewed: 07/13/2021 Elsevier Patient Education  2024 [employer].

## 2024-10-09 NOTE — Progress Notes (Unsigned)
 BP 120/84   Pulse 76   Temp 98 F (36.7 C)   Ht 5' 1 (1.549 m)   SpO2 98%   BMI 27.96 kg/m    Subjective:    Patient ID: Karina Keller, female    DOB: 18-Feb-1966, 58 y.o.   MRN: [id number]  HPI: Karina Keller is a 58 y.o. female presenting on 10/09/2024 for Vaginal Discharge (Pt states she has not had a menstrual cycle for about 4-5 years. Pt states she felt a palpation with some pain on vaginal area about 20 days ago.  Pt states she noticed she was having discharge, pt states about 20 days ago it was vaginal discharge with light blood. Since then it has been clear discharge.) and Nasal Congestion (Pt states she started with nasal congestion and runny nose yesterday, light cough and sore throat.)   HPI   Chief Complaint  Patient presents with   Vaginal Discharge    Pt states she has not had a menstrual cycle for about 4-5 years. Pt states she felt a palpation with some pain on vaginal area about 20 days ago.  Pt states she noticed she was having discharge, pt states about 20 days ago it was vaginal discharge with light blood. Since then it has been clear discharge.   Nasal Congestion    Pt states she started with nasal congestion and runny nose yesterday, light cough and sore throat.    The palpation also described as Felt like a pulse She is not sexually active; it's been > 1 year  Relevant past medical, surgical, family and social history reviewed and updated as indicated. Interim medical history since our last visit reviewed. Allergies and medications reviewed and updated.   Current Outpatient Medications:    amLODipine  (NORVASC ) 5 MG tablet, Take 1 tablet (5 mg total) by mouth daily., Disp: 30 tablet, Rfl: 6   atorvastatin  (LIPITOR) 20 MG tablet, Take 1 tablet (20 mg total) by mouth at bedtime., Disp: 30 tablet, Rfl: 6   omeprazole  (PRILOSEC) 40 MG capsule, Take 1 capsule (40 mg total) by mouth daily as needed. Tome una capsula por boca diaria cuando sea  necesario, Disp: 30 capsule, Rfl: 3   benzonatate  (TESSALON  PERLES) 100 MG capsule, 1-2 po q 8 hour prn cough. Tome 1-2 capsulas por boca cada 8 horas cuando sea necesario para la tos (Patient not taking: Reported on 10/09/2024), Disp: 20 capsule, Rfl: 3    Review of Systems  Per HPI unless specifically indicated above     Objective:    BP 120/84   Pulse 76   Temp 98 F (36.7 C)   Ht 5' 1 (1.549 m)   SpO2 98%   BMI 27.96 kg/m   Wt Readings from Last 3 Encounters:  08/21/24 148 lb (67.1 kg)  05/08/24 147 lb 12 oz (67 kg)  09/13/23 145 lb (65.8 kg)    Physical Exam Constitutional:      General: She is not in acute distress.    Appearance: She is not toxic-appearing.  HENT:     Head: Normocephalic and atraumatic.  Pulmonary:     Effort: Pulmonary effort is normal. No respiratory distress.  Skin:    General: Skin is warm and dry.  Neurological:     Mental Status: She is alert and oriented to person, place, and time.  Psychiatric:        Behavior: Behavior normal.           Assessment & Plan:  Encounter Diagnoses  Name Primary?   Upper respiratory tract infection, unspecified type Yes   Postmenopausal vaginal bleeding    Not proficient in English language       -Pt sent for pelvic US  -she was given application for [facility] -she Will need referral to gyn

## 2024-10-10 ENCOUNTER — Other Ambulatory Visit: Payer: Self-pay | Admitting: Physician Assistant

## 2024-10-16 ENCOUNTER — Ambulatory Visit (HOSPITAL_COMMUNITY)
Admission: RE | Admit: 2024-10-16 | Discharge: 2024-10-16 | Disposition: A | Discharge status: Home / Self Care | Payer: Self-pay | Source: Ambulatory Visit | Attending: Physician Assistant | Admitting: Physician Assistant

## 2024-10-16 DIAGNOSIS — N950 Postmenopausal bleeding: Secondary | ICD-10-CM | POA: Insufficient documentation

## 2024-10-17 NOTE — Op Note (Unsigned)
 Karina Keller, Karina Keller MEDICAL RECORD NO: [id number] ACCOUNT NO: [id number] DATE OF BIRTH: 1966/04/28 FACILITY: [facility] LOCATION: [facility]  PHYSICIAN: Debby DOROTHA Dinsmore, MD  Operative Report   DATE OF PROCEDURE: 10/16/2024  PREOPERATIVE DIAGNOSES: 1. Menorrhagia, unresponsive to conservative therapy. 2. Endometrial polyp.  POSTOPERATIVE DIAGNOSES: 1. Menorrhagia, unresponsive to conservative therapy. 2. Endometrial polyp. 3. Fibroid uterus. 4. Adenomyosis.  PROCEDURE: 1. Total vaginal hysterectomy. 2. Bilateral salpingectomy.  SURGEON: Debby DOROTHA Dinsmore, MD  FIRST ASSISTANT: Beverli Dinsmore, MD  SECOND ASSISTANT: Estelle, [place] student  ANESTHESIA: General endotracheal anesthesia.  INDICATIONS: A 58 year old gravida 2 para 2 patient with a long history of menorrhagia, unresponsive to conservative therapy. The patient is known to have a large endometrial polyp and has elected for definitive surgical intervention.  DESCRIPTION OF PROCEDURE: After adequate general endotracheal anesthesia, the patient was placed in the dorsal supine position with the legs in the candy cane stirrups. The patient's abdomen, perineum, and vagina were prepped and draped in normal sterile  fashion. The patient did receive 2 g of IV Ancef and 500 mg metronidazole  for surgical prophylaxis during the procedure. Timeout was performed. A weighted speculum was placed in the posterior vaginal vault. The bladder was then drained yielding 100 mL  clear urine. The cervix was grasped with two thyroid tenacula and the cervix was circumferentially injected with 1% lidocaine with 1:100,000 epinephrine. A direct posterior colpotomy incision was made. Upon entry into the posterior cul-de-sac, a  long-billed weighted speculum was placed and the uterosacral ligaments were bilaterally clamped, transected, and tagged for later identification. Anterior cervix was circumferentially incised with the Bovie. The  cardinal ligaments were then bilaterally  clamped, transected, and suture ligated with 0 Vicryl suture. Sequential clamping, cutting, and suturing continued bilaterally through the broad ligament. Ultimately, the anterior cul-de-sac was entered sharply without difficulty. There was some scar  tissue that was noted from prior cesarean section. Deaver retractor was placed within and elevated the bladder anteriorly. Ultimately, the cornua were bilaterally clamped. Given the girth of the uterus, the uterine cavity was cored out and morcellated to  allow for decompression so that the cornua could bilaterally be clamped, transected, and doubly ligated with 0 Vicryl suture. Several sutures were used for hemostasis on the vaginal cuff and ultimately each fallopian tube was identified, clamped,  resected, and suture ligated with 0 Vicryl suture. Ovaries appeared normal. Good hemostasis noted. The peritoneum was then closed with a pursestring of 2-0 PDS suture and the vaginal cuff was then closed with a running 0 Vicryl suture, nonlocking, and  good approximation of edges. The uterosacral ligaments were plicated centrally and the rest of the cuff closed with 0 Vicryl suture. Good hemostasis noted at the end of the procedure. The bladder was catheterized at the end yielding an additional 50 mL  clear urine. The patient did receive 30 mg of intravenous Toradol at the end of the procedure. There were no complications.  ESTIMATED BLOOD LOSS: 150 mL.  INTRAOPERATIVE FLUIDS: 1000 mL.  URINE OUTPUT: 150 mL.  DISPOSITION: The patient was taken to the recovery room in good condition.   PUS D: 10/17/2024 10:57:53 am T: 10/17/2024 12:50:00 pm  JOB: [id number]/ [id number]

## 2024-10-22 ENCOUNTER — Other Ambulatory Visit: Payer: Self-pay | Admitting: Physician Assistant

## 2024-10-22 ENCOUNTER — Ambulatory Visit: Payer: Self-pay | Admitting: Physician Assistant

## 2024-10-22 DIAGNOSIS — R9389 Abnormal findings on diagnostic imaging of other specified body structures: Secondary | ICD-10-CM

## 2024-10-22 DIAGNOSIS — N950 Postmenopausal bleeding: Secondary | ICD-10-CM

## 2024-10-22 DIAGNOSIS — N859 Noninflammatory disorder of uterus, unspecified: Secondary | ICD-10-CM

## 2024-10-24 ENCOUNTER — Encounter: Payer: Self-pay | Admitting: Physician Assistant

## 2024-11-27 ENCOUNTER — Other Ambulatory Visit: Payer: Self-pay | Admitting: Physician Assistant

## 2024-12-10 ENCOUNTER — Other Ambulatory Visit: Payer: Self-pay | Admitting: Physician Assistant

## 2024-12-10 DIAGNOSIS — E785 Hyperlipidemia, unspecified: Secondary | ICD-10-CM

## 2024-12-10 DIAGNOSIS — I10 Essential (primary) hypertension: Secondary | ICD-10-CM

## 2024-12-25 ENCOUNTER — Ambulatory Visit: Admitting: Physician Assistant

## 2025-01-01 ENCOUNTER — Ambulatory Visit: Admitting: Physician Assistant

## 2025-01-15 ENCOUNTER — Encounter: Payer: Self-pay | Admitting: Obstetrics & Gynecology

## 2025-01-22 ENCOUNTER — Ambulatory Visit: Admitting: Physician Assistant
